# Patient Record
Sex: Female | Born: 1995 | Race: Black or African American | Hispanic: No | Smoking: Current every day smoker
Health system: Southern US, Community
[De-identification: ages and names within clinical notes are randomized; demographics above are authoritative.]

## PROBLEM LIST (undated history)

## (undated) DIAGNOSIS — L309 Dermatitis, unspecified: Secondary | ICD-10-CM

## (undated) HISTORY — DX: Dermatitis, unspecified: L30.9

## (undated) HISTORY — PX: NO PAST SURGERIES: SHX2092

---
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
## 2017-10-27 ENCOUNTER — Encounter: Payer: Self-pay | Admitting: *Deleted

## 2017-10-27 ENCOUNTER — Emergency Department: Payer: Self-pay

## 2017-10-27 ENCOUNTER — Other Ambulatory Visit: Payer: Self-pay

## 2017-10-27 ENCOUNTER — Emergency Department
Admission: EM | Admit: 2017-10-27 | Discharge: 2017-10-27 | Disposition: A | Discharge status: Home / Self Care | Payer: Self-pay | Attending: Emergency Medicine | Admitting: Emergency Medicine

## 2017-10-27 DIAGNOSIS — F17200 Nicotine dependence, unspecified, uncomplicated: Secondary | ICD-10-CM | POA: Insufficient documentation

## 2017-10-27 DIAGNOSIS — R0789 Other chest pain: Secondary | ICD-10-CM | POA: Insufficient documentation

## 2017-10-27 DIAGNOSIS — Z79899 Other long term (current) drug therapy: Secondary | ICD-10-CM | POA: Insufficient documentation

## 2017-10-27 LAB — CBC
HEMATOCRIT: 38.7 % (ref 35.0–47.0)
Hemoglobin: 12.7 g/dL (ref 12.0–16.0)
MCH: 29.5 pg (ref 26.0–34.0)
MCHC: 32.9 g/dL (ref 32.0–36.0)
MCV: 89.5 fL (ref 80.0–100.0)
PLATELETS: 384 10*3/uL (ref 150–440)
RBC: 4.32 MIL/uL (ref 3.80–5.20)
RDW: 14.5 % (ref 11.5–14.5)
WBC: 8.6 10*3/uL (ref 3.6–11.0)

## 2017-10-27 LAB — BASIC METABOLIC PANEL
Anion gap: 8 (ref 5–15)
BUN: 9 mg/dL (ref 6–20)
CO2: 26 mmol/L (ref 22–32)
CREATININE: 0.81 mg/dL (ref 0.44–1.00)
Calcium: 9.4 mg/dL (ref 8.9–10.3)
Chloride: 105 mmol/L (ref 101–111)
GFR calc Af Amer: >60 mL/min (ref >60)
GLUCOSE: 89 mg/dL (ref 65–99)
POTASSIUM: 3.9 mmol/L (ref 3.5–5.1)
Sodium: 139 mmol/L (ref 135–145)

## 2017-10-27 LAB — TROPONIN I: Troponin I: <0.03 ng/mL (ref <0.03)

## 2017-10-27 NOTE — ED Notes (Signed)
Pt reports that symptoms started when she was smoking a black and mild on Thursday.  Pt states that she has smoked these in the past and denies any changes to this.  Pt reports that every time she smokes black and milds she now has chest pain.  Pt advised by this RN to avoid smoking black and milds.

## 2017-10-27 NOTE — ED Notes (Signed)
Pt discharged to home.  Family member driving.  Discharge instructions reviewed.  Verbalized understanding.  No questions or concerns at this time.  Teach back verified.  Pt in NAD.  No items left in ED.   

## 2017-10-27 NOTE — ED Triage Notes (Signed)
Pt reports right sided chest pain for the past three days. No cough or congestions reported and no SOB. Pain is worse when straining but not when pressure is applied to chest. Pt denies heart hx. No trauma to chest reported.

## 2017-10-27 NOTE — ED Provider Notes (Signed)
[facility] Emergency Department Provider Note  Time seen: 3:59 PM  I have reviewed the triage vital signs and the nursing notes.   HISTORY  Chief Complaint Chest Pain    HPI KENYATTE GRUBER is a 22 y.o. female with no past medical history presents to the emergency department for 3 days of chest pain.  According to the patient 3 days ago she was smoking a black and mild cigar when she developed central chest discomfort.  States the chest pain will come and go intermittently, is brief when it occurs and will go away for minutes or longer at a time and then come back randomly.  Denies any known inciting event such as deep breathing eating.  States it is somewhat worse if she coughs or if she pushes on her chest.  No leg pain or swelling.  No pleuritic pain.  During my examination the patient appears well, calm, cooperative, no distress, heart rate of 70 bpm throughout the exam.   No past medical history on file.  There are no active problems to display for this patient.   No past surgical history on file.  Prior to Admission medications   Medication Sig Start Date End Date Taking? Authorizing Provider  naproxen (NAPROSYN) 500 MG tablet Take 1 tablet (500 mg total) by mouth 2 (two) times daily with a meal. 01/30/16   Cuthriell, Delorise Royals, PA-C    No Known Allergies  No family history on file.  Social History Social History   Tobacco Use  . Smoking status: Current Some Day Smoker  . Smokeless tobacco: Never Used  Substance Use Topics  . Alcohol use: No  . Drug use: No    Review of Systems Constitutional: Negative for fever, patient does state intermittent chills over the past few days Eyes: Negative for visual complaints ENT: Negative for recent illness/congestion Cardiovascular: Intermittent chest pain, mild to moderate, dull central chest pain that comes and goes. Respiratory: Negative for shortness of breath.   Gastrointestinal: Negative for  abdominal pain, vomiting and diarrhea. Genitourinary: Negative for urinary compaints Musculoskeletal: Negative for leg pain or swelling Skin: Negative for skin complaints  Neurological: Negative for headache All other ROS negative  ____________________________________________   PHYSICAL EXAM:  VITAL SIGNS: ED Triage Vitals  Enc Vitals Group     BP 10/27/17 1349 (!) 128/54     Pulse Rate 10/27/17 1349 99     Resp 10/27/17 1349 16     Temp 10/27/17 1349 98 F (36.7 C)     Temp Source 10/27/17 1349 Oral     SpO2 10/27/17 1349 100 %     Weight 10/27/17 1350 135 lb (61.2 kg)     Height 10/27/17 1350 5\' 7"  (1.702 m)     Head Circumference --      Peak Flow --      Pain Score 10/27/17 1350 6     Pain Loc --      Pain Edu? --      Excl. in GC? --    Constitutional: Alert and oriented. Well appearing and in no distress. Eyes: Normal exam ENT   Head: Normocephalic and atraumatic.   Mouth/Throat: Mucous membranes are moist. Cardiovascular: Normal rate, regular rhythm. No murmur Respiratory: Normal respiratory effort without tachypnea nor retractions. Breath sounds are clear.  Moderate central chest wall/sternal tenderness to palpation Gastrointestinal: Soft and nontender. No distention.  Musculoskeletal: Nontender with normal range of motion in all extremities. No lower extremity tenderness or  edema. Neurologic:  Normal speech and language. No gross focal neurologic deficits are appreciated. Skin:  Skin is warm, dry and intact.  Psychiatric: Mood and affect are normal.   ____________________________________________    EKG  EKG reviewed and interpreted by myself shows normal sinus rhythm 89 bpm with a narrow QRS, normal axis, normal intervals, no concerning ST changes.  Reassuring EKG.  ____________________________________________    RADIOLOGY  Chest x-ray clear  ____________________________________________   INITIAL IMPRESSION / ASSESSMENT AND PLAN / ED  COURSE  Pertinent labs & imaging results that were available during my care of the patient were reviewed by me and considered in my medical decision making (see chart for details).  Patient presents emergency department for intermittent chest discomfort over the past 3 days which started while smoking a black a mild cigar.  Differential would include muscular skeletal pain, ACS, pleurisy, less likely PE given now normal vitals with no pleuritic nature to her discomfort and a normal physical exam.  Patient's physical exam is extremely reassuring, moderate chest wall tenderness to palpation highly suspect musculoskeletal discomfort/inflammation.  Chest x-ray normal.  Labs are normal including negative troponin.  EKG is reassuring.  I discussed using ibuprofen 400 mg twice daily for the next 7 days as needed for discomfort and following up with her doctor.  Patient agreeable to this plan of care.   ____________________________________________   FINAL CLINICAL IMPRESSION(S) / ED DIAGNOSES  Chest wall pain    Minna AntisPaduchowski, Keli Buehner, MD 10/27/17 [id number]

## 2017-10-27 NOTE — Discharge Instructions (Addendum)
You have been seen in the emergency department today for chest pain. Your workup has shown normal results. As we discussed please follow-up with your primary care physician in the next 1-2 days for recheck. Return to the emergency department for any further chest pain, trouble breathing, or any other symptom personally concerning to yourself.  Please use over-the-counter ibuprofen 400 mg every 12 hours as needed for discomfort for the next 7 days.  Discontinue use after 7 days.

## 2018-11-13 ENCOUNTER — Emergency Department
Admission: EM | Admit: 2018-11-13 | Discharge: 2018-11-13 | Disposition: A | Discharge status: Home / Self Care | Payer: Self-pay | Attending: Emergency Medicine | Admitting: Emergency Medicine

## 2018-11-13 ENCOUNTER — Encounter: Payer: Self-pay | Admitting: Emergency Medicine

## 2018-11-13 ENCOUNTER — Other Ambulatory Visit: Payer: Self-pay

## 2018-11-13 DIAGNOSIS — Z79899 Other long term (current) drug therapy: Secondary | ICD-10-CM | POA: Insufficient documentation

## 2018-11-13 DIAGNOSIS — F17200 Nicotine dependence, unspecified, uncomplicated: Secondary | ICD-10-CM | POA: Insufficient documentation

## 2018-11-13 DIAGNOSIS — K115 Sialolithiasis: Secondary | ICD-10-CM

## 2018-11-13 HISTORY — DX: Sialolithiasis: K11.5

## 2018-11-13 MED ORDER — AMOXICILLIN 500 MG PO CAPS: 500.0000 mg | ORAL_CAPSULE | Freq: Three times a day (TID) | ORAL | 0 refills | Status: DC

## 2018-11-13 NOTE — ED Triage Notes (Signed)
Sore throat since yesterday.

## 2018-11-13 NOTE — ED Provider Notes (Signed)
[facility] Emergency Department Provider Note  ____________________________________________   First MD Initiated Contact with Patient 11/13/18 1428     (approximate)  I have reviewed the triage vital signs and the nursing notes.   HISTORY  Chief Complaint Sore Throat    HPI RENADA ROEPER is a 23 y.o. female presents emergency department complaining of sore throat since yesterday.  She states when she eats the side of her throat gets very large and then will get smaller after about an hour.  She denies any fever or chills.  She denies any chest pain or shortness of breath.    History reviewed. No pertinent past medical history.  There are no active problems to display for this patient.   History reviewed. No pertinent surgical history.  Prior to Admission medications   Medication Sig Start Date End Date Taking? Authorizing Provider  amoxicillin (AMOXIL) 500 MG capsule Take 1 capsule (500 mg total) by mouth 3 (three) times daily. 11/13/18   Braley Luckenbaugh, Roselyn Bering, PA-C  naproxen (NAPROSYN) 500 MG tablet Take 1 tablet (500 mg total) by mouth 2 (two) times daily with a meal. 01/30/16   Cuthriell, Delorise Royals, PA-C    Allergies Patient has no known allergies.  No family history on file.  Social History Social History   Tobacco Use  . Smoking status: Current Some Day Smoker  . Smokeless tobacco: Never Used  Substance Use Topics  . Alcohol use: No  . Drug use: No    Review of Systems  Constitutional: No fever/chills Eyes: No visual changes. ENT: Positive sore throat. Respiratory: Denies cough Genitourinary: Negative for dysuria. Musculoskeletal: Negative for back pain. Skin: Negative for rash.    ____________________________________________   PHYSICAL EXAM:  VITAL SIGNS: ED Triage Vitals  Enc Vitals Group     BP 11/13/18 1402 130/72     Pulse Rate 11/13/18 1402 (!) 126     Resp --      Temp 11/13/18 1402 98.4 F (36.9 C)     Temp  Source 11/13/18 1402 Oral     SpO2 11/13/18 1402 100 %     Weight 11/13/18 1358 132 lb (59.9 kg)     Height 11/13/18 1404 5\' 6"  (1.676 m)     Head Circumference --      Peak Flow --      Pain Score 11/13/18 1358 4     Pain Loc --      Pain Edu? --      Excl. in GC? --     Constitutional: Alert and oriented. Well appearing and in no acute distress. Eyes: Conjunctivae are normal.  Head: Atraumatic. Nose: No congestion/rhinnorhea. Mouth/Throat: Mucous membranes are moist.  Throat with cobblestoning posteriorly Neck:  supple no lymphadenopathy noted, some swelling noted at the salivary gland on the left side of the neck Cardiovascular: Normal rate, regular rhythm. Heart sounds are normal Respiratory: Normal respiratory effort.  No retractions, lungs c t a  GU: deferred Musculoskeletal: FROM all extremities, warm and well perfused Neurologic:  Normal speech and language.  Skin:  Skin is warm, dry and intact. No rash noted. Psychiatric: Mood and affect are normal. Speech and behavior are normal.  ____________________________________________   LABS (all labs ordered are listed, but only abnormal results are displayed)  Labs Reviewed - No data to display ____________________________________________   ____________________________________________  RADIOLOGY    ____________________________________________   PROCEDURES  Procedure(s) performed: No  Procedures    ____________________________________________   INITIAL  IMPRESSION / ASSESSMENT AND PLAN / ED COURSE  Pertinent labs & imaging results that were available during my care of the patient were reviewed by me and considered in my medical decision making (see chart for details).   Patient is 23 year old female presents emergency department complaining of a sore throat with swelling to the left side of the neck upon eating and drinking.  Physical exam shows the throat to be cobblestoned.  Left side of the neck is  minimally swollen up along the salivary gland.  Remainder the exam is unremarkable  Explained the findings to the patient.  Explained her this is more of a salivary gland infection/stone.  She is to drink through a straw consistently for the next few days, take the antibiotic as prescribed.  If she is not improving in 2 to 3 days she needs to follow-up with the ENT.  If she is worsening she should return emergency department.  She states she understands will comply.  She was discharged in stable condition.     As part of my medical decision making, I reviewed the following data within the electronic MEDICAL RECORD NUMBER Nursing notes reviewed and incorporated, Old chart reviewed, Notes from prior ED visits and [place] Controlled Substance Database  ____________________________________________   FINAL CLINICAL IMPRESSION(S) / ED DIAGNOSES  Final diagnoses:  Salivary stone      NEW MEDICATIONS STARTED DURING THIS VISIT:  New Prescriptions   AMOXICILLIN (AMOXIL) 500 MG CAPSULE    Take 1 capsule (500 mg total) by mouth 3 (three) times daily.     Note:  This document was prepared using Dragon voice recognition software and may include unintentional dictation errors.    Faythe GheeFisher, Jaken Fregia W, PA-C 11/13/18 1438    Emily FilbertWilliams, Jonathan E, MD 11/13/18 [phone]

## 2018-11-13 NOTE — Discharge Instructions (Addendum)
Follow-up with [facility] if not better in 2 to 3 days.  Drink through a straw for the next few days.  Take the medication as prescribed.  If you are worsening return emergency department.  If the area is not improving you should follow-up with the ENT doctors.

## 2019-12-13 ENCOUNTER — Other Ambulatory Visit: Payer: Self-pay

## 2019-12-13 ENCOUNTER — Emergency Department
Admission: EM | Admit: 2019-12-13 | Discharge: 2019-12-13 | Disposition: A | Discharge status: Home / Self Care | Payer: Self-pay | Attending: Emergency Medicine | Admitting: Emergency Medicine

## 2019-12-13 ENCOUNTER — Emergency Department: Payer: Self-pay

## 2019-12-13 ENCOUNTER — Encounter: Payer: Self-pay | Admitting: Emergency Medicine

## 2019-12-13 DIAGNOSIS — Y939 Activity, unspecified: Secondary | ICD-10-CM | POA: Insufficient documentation

## 2019-12-13 DIAGNOSIS — Y999 Unspecified external cause status: Secondary | ICD-10-CM | POA: Insufficient documentation

## 2019-12-13 DIAGNOSIS — Z87891 Personal history of nicotine dependence: Secondary | ICD-10-CM | POA: Insufficient documentation

## 2019-12-13 DIAGNOSIS — S300XXA Contusion of lower back and pelvis, initial encounter: Secondary | ICD-10-CM | POA: Insufficient documentation

## 2019-12-13 DIAGNOSIS — W228XXA Striking against or struck by other objects, initial encounter: Secondary | ICD-10-CM | POA: Insufficient documentation

## 2019-12-13 DIAGNOSIS — Z79899 Other long term (current) drug therapy: Secondary | ICD-10-CM | POA: Insufficient documentation

## 2019-12-13 DIAGNOSIS — Y929 Unspecified place or not applicable: Secondary | ICD-10-CM | POA: Insufficient documentation

## 2019-12-13 MED ORDER — LIDOCAINE 5 % EX PTCH
1.0000 | MEDICATED_PATCH | CUTANEOUS | Status: DC
  Administered 2019-12-13: 1 via TRANSDERMAL
  Filled 2019-12-13 (×2): qty 1

## 2019-12-13 MED ORDER — NAPROXEN 500 MG PO TABS: 500.0000 mg | ORAL_TABLET | Freq: Two times a day (BID) | ORAL | Status: DC

## 2019-12-13 MED ORDER — LIDOCAINE 5 % EX PTCH: 1.0000 | MEDICATED_PATCH | Freq: Two times a day (BID) | CUTANEOUS | 0 refills | Status: AC

## 2019-12-13 MED ORDER — TRAMADOL HCL 50 MG PO TABS
50.0000 mg | ORAL_TABLET | Freq: Once | ORAL | Status: AC
  Administered 2019-12-13: 18:00:00 50 mg via ORAL
  Filled 2019-12-13: qty 1

## 2019-12-13 MED ORDER — TRAMADOL HCL 50 MG PO TABS: 50.0000 mg | ORAL_TABLET | Freq: Four times a day (QID) | ORAL | 0 refills | Status: DC | PRN

## 2019-12-13 NOTE — Discharge Instructions (Addendum)
Follow discharge care instruction take medication as directed. °

## 2019-12-13 NOTE — ED Triage Notes (Signed)
Pt presents to ED via POV with c/o tailbone pain, pt states went to sit on couch and ended up sitting on a cup holder, pt states incident happened 3 days ago. Pt c/o increasing tailbone pain since then. Pt ambulatory without difficulty at this time. Pt states has been putting bruise cream on her tail bone without relief.

## 2019-12-13 NOTE — ED Notes (Signed)
See triage note. Pt ambulatory to room. Pot c/o 9/10 pain tailbone pain. Pt in NAD at this time.

## 2019-12-13 NOTE — ED Provider Notes (Signed)
[facility] Emergency Department Provider Note   ____________________________________________   First MD Initiated Contact with Patient 12/13/19 1543     (approximate)  I have reviewed the triage vital signs and the nursing notes.   HISTORY  Chief Complaint Tailbone Pain    HPI April Zuniga is a 24 y.o. female patient complain of coccyx pain secondary to contusion.  Patient states she jumped to sit on the sofa but hit the armrest instead of the cushion.  Incident occurred 3 days ago.  Patient the pain has increased and no relief with over-the-counter "bruise creams".  Patient states pain increased with trying to sit down so mostly lay on her stomach.  Patient denies bladder or bowel dysfunction.  Patient rates the pain as 8/10.  Patient described the pain as "achy".         History reviewed. No pertinent past medical history.  There are no problems to display for this patient.   History reviewed. No pertinent surgical history.  Prior to Admission medications   Medication Sig Start Date End Date Taking? Authorizing Provider  amoxicillin (AMOXIL) 500 MG capsule Take 1 capsule (500 mg total) by mouth 3 (three) times daily. 11/13/18   Fisher, Roselyn Bering, PA-C  lidocaine (LIDODERM) 5 % Place 1 patch onto the skin every 12 (twelve) hours. Remove & Discard patch within 12 hours or as directed by MD 12/13/19 12/12/20  Joni Reining, PA-C  naproxen (NAPROSYN) 500 MG tablet Take 1 tablet (500 mg total) by mouth 2 (two) times daily with a meal. 01/30/16   Cuthriell, Delorise Royals, PA-C  naproxen (NAPROSYN) 500 MG tablet Take 1 tablet (500 mg total) by mouth 2 (two) times daily with a meal. 12/13/19   Joni Reining, PA-C  traMADol (ULTRAM) 50 MG tablet Take 1 tablet (50 mg total) by mouth every 6 (six) hours as needed for moderate pain. 12/13/19   Joni Reining, PA-C    Allergies Patient has no known allergies.  No family history on file.  Social History Social  History   Tobacco Use  . Smoking status: Former Games developer  . Smokeless tobacco: Never Used  Substance Use Topics  . Alcohol use: No  . Drug use: No    Review of Systems Constitutional: No fever/chills Eyes: No visual changes. ENT: No sore throat. Cardiovascular: Denies chest pain. Respiratory: Denies shortness of breath. Gastrointestinal: No abdominal pain.  No nausea, no vomiting.  No diarrhea.  No constipation. Genitourinary: Negative for dysuria. Musculoskeletal: Coccyx pain. Skin: Negative for rash. Neurological: Negative for headaches, focal weakness or numbness.   ____________________________________________   PHYSICAL EXAM:  VITAL SIGNS: ED Triage Vitals  Enc Vitals Group     BP 12/13/19 1524 (!) 142/87     Pulse Rate 12/13/19 1524 (!) 108     Resp 12/13/19 1524 18     Temp 12/13/19 1524 98.5 F (36.9 C)     Temp src --      SpO2 12/13/19 1524 100 %     Weight 12/13/19 1526 135 lb (61.2 kg)     Height 12/13/19 1526 5\' 7"  (1.702 m)     Head Circumference --      Peak Flow --      Pain Score 12/13/19 1526 8     Pain Loc --      Pain Edu? --      Excl. in GC? --     Constitutional: Alert and oriented. Well appearing and  in no acute distress. Cardiovascular: Normal rate, regular rhythm. Grossly normal heart sounds.  Good peripheral circulation. Respiratory: Normal respiratory effort.  No retractions. Lungs CTAB. Genitourinary: Deferred Musculoskeletal: Chaperoned exam reveals moderate guarding palpation L4-S1.   Neurologic:  Normal speech and language. No gross focal neurologic deficits are appreciated. No gait instability. Skin:  Skin is warm, dry and intact. No rash noted.  No abrasion or ecchymosis.   Psychiatric: Mood and affect are normal. Speech and behavior are normal.  ____________________________________________   LABS (all labs ordered are listed, but only abnormal results are displayed)  Labs Reviewed  POC URINE PREG, ED    ____________________________________________  EKG   ____________________________________________  RADIOLOGY  ED MD interpretation:    Official radiology report(s): DG Sacrum/Coccyx  Result Date: 12/13/2019 CLINICAL DATA:  [place] onto cup older 3 days ago. Persistent tailbone pain. Initial encounter. EXAM: SACRUM AND COCCYX - 2+ VIEW COMPARISON:  None. FINDINGS: There is no evidence of fracture or other focal bone lesions. IMPRESSION: Negative. Electronically Signed   By: Marlaine Hind M.D.   On: 12/13/2019 17:05    ____________________________________________   PROCEDURES  Procedure(s) performed (including Critical Care):  Procedures   ____________________________________________   INITIAL IMPRESSION / ASSESSMENT AND PLAN / ED COURSE  As part of my medical decision making, I reviewed the following data within the [facility]      Patient presents with 3 days of coccyx pain secondary to contusion.  Discussed negative x-ray findings with patient.  Physical exam consistent with coccyx contusion.  Patient given discharge care instruction advised take medication as directed.  Patient advised follow-up PCP.    April Zuniga was evaluated in Emergency Department on 12/13/2019 for the symptoms described in the history of present illness. She was evaluated in the context of the global COVID-19 pandemic, which necessitated consideration that the patient might be at risk for infection with the SARS-CoV-2 virus that causes COVID-19. Institutional protocols and algorithms that pertain to the evaluation of patients at risk for COVID-19 are in a state of rapid change based on information released by regulatory bodies including the CDC and federal and state organizations. These policies and algorithms were followed during the patient's care in the ED.       ____________________________________________   FINAL CLINICAL IMPRESSION(S) / ED DIAGNOSES  Final diagnoses:   Contusion of coccyx, initial encounter     ED Discharge Orders         Ordered    traMADol (ULTRAM) 50 MG tablet  Every 6 hours PRN     12/13/19 1739    naproxen (NAPROSYN) 500 MG tablet  2 times daily with meals     12/13/19 1739    lidocaine (LIDODERM) 5 %  Every 12 hours     12/13/19 1739           Note:  This document was prepared using Dragon voice recognition software and may include unintentional dictation errors.    Sable Feil, PA-C 12/13/19 1741    Arta Silence, MD 12/13/19 2024

## 2019-12-13 NOTE — ED Notes (Signed)
Pt with c/o tenderness to tailbone. Pt has small, round, light-discoloration noted at coccyx. Skin is intact, no swelling, redness, or discharge noted.

## 2019-12-15 LAB — POCT PREGNANCY, URINE: Preg Test, Ur: NEGATIVE

## 2019-12-16 ENCOUNTER — Other Ambulatory Visit: Payer: Self-pay

## 2019-12-16 ENCOUNTER — Encounter: Payer: Self-pay | Admitting: Emergency Medicine

## 2019-12-16 ENCOUNTER — Emergency Department
Admission: EM | Admit: 2019-12-16 | Discharge: 2019-12-16 | Disposition: A | Discharge status: Home / Self Care | Payer: Self-pay | Attending: Student in an Organized Health Care Education/Training Program | Admitting: Student in an Organized Health Care Education/Training Program

## 2019-12-16 DIAGNOSIS — L0501 Pilonidal cyst with abscess: Secondary | ICD-10-CM | POA: Insufficient documentation

## 2019-12-16 DIAGNOSIS — Z79899 Other long term (current) drug therapy: Secondary | ICD-10-CM | POA: Insufficient documentation

## 2019-12-16 DIAGNOSIS — Z87891 Personal history of nicotine dependence: Secondary | ICD-10-CM | POA: Insufficient documentation

## 2019-12-16 HISTORY — PX: PILONIDAL CYST DRAINAGE: SHX743

## 2019-12-16 MED ORDER — OXYCODONE-ACETAMINOPHEN 5-325 MG PO TABS: 1.0000 | ORAL_TABLET | Freq: Four times a day (QID) | ORAL | 0 refills | Status: AC | PRN

## 2019-12-16 MED ORDER — LIDOCAINE-PRILOCAINE 2.5-2.5 % EX CREA
TOPICAL_CREAM | Freq: Once | CUTANEOUS | Status: AC
  Filled 2019-12-16: qty 5

## 2019-12-16 MED ORDER — SULFAMETHOXAZOLE-TRIMETHOPRIM 800-160 MG PO TABS: 1.0000 | ORAL_TABLET | Freq: Two times a day (BID) | ORAL | 0 refills | Status: DC

## 2019-12-16 MED ORDER — LIDOCAINE-EPINEPHRINE 2 %-1:100000 IJ SOLN
10.0000 mL | Freq: Once | INTRAMUSCULAR | Status: AC
  Administered 2019-12-16: 10 mL

## 2019-12-16 MED ORDER — OXYCODONE-ACETAMINOPHEN 5-325 MG PO TABS
1.0000 | ORAL_TABLET | ORAL | Status: DC | PRN
  Administered 2019-12-16: 1 via ORAL
  Filled 2019-12-16: qty 1

## 2019-12-16 NOTE — ED Notes (Signed)
See triage note  Presents with possible pilonidal abscess   States she was seen 3 days ago for pain is tailbone  States she sat down hard on something on the sofa   States the tailbone pain is better but now has possible abscess

## 2019-12-16 NOTE — ED Triage Notes (Signed)
Pt reports an abscess to her buttocks and needs it drained.

## 2019-12-16 NOTE — ED Provider Notes (Signed)
[facility] Emergency Department Provider Note  ____________________________________________  Time seen: Approximately 3:05 PM  I have reviewed the triage vital signs and the nursing notes.   HISTORY  Chief Complaint Abscess   HPI April Zuniga is a 24 y.o. female presenting to the emergency department for treatment and evaluation of tender area overlying her tailbone.  3 days ago she was here after she stepped down really hard on the console of her couch and sustained a bruised tailbone.  She was prescribed tramadol and Naprosyn which have helped but 2 days ago she noticed a very tender raised area on the right buttock/tailbone that has continued to increase in size and become more painful.  No previous skin infections or abscess.  No previous pilonidal cyst.   History reviewed. No pertinent past medical history.  There are no problems to display for this patient.   History reviewed. No pertinent surgical history.  Prior to Admission medications   Medication Sig Start Date End Date Taking? Authorizing Provider  lidocaine (LIDODERM) 5 % Place 1 patch onto the skin every 12 (twelve) hours. Remove & Discard patch within 12 hours or as directed by MD 12/13/19 12/12/20  Joni Reining, PA-C  naproxen (NAPROSYN) 500 MG tablet Take 1 tablet (500 mg total) by mouth 2 (two) times daily with a meal. 12/13/19   Joni Reining, PA-C  oxyCODONE-acetaminophen (PERCOCET) 5-325 MG tablet Take 1 tablet by mouth every 6 (six) hours as needed. 12/16/19 12/15/20  Olita Takeshita, Rulon Eisenmenger B, FNP  sulfamethoxazole-trimethoprim (BACTRIM DS) 800-160 MG tablet Take 1 tablet by mouth 2 (two) times daily. 12/16/19   Aeva Posey, Rulon Eisenmenger B, FNP  traMADol (ULTRAM) 50 MG tablet Take 1 tablet (50 mg total) by mouth every 6 (six) hours as needed for moderate pain. 12/13/19   Joni Reining, PA-C    Allergies Patient has no known allergies.  No family history on file.  Social History Social History   Tobacco  Use  . Smoking status: Former Games developer  . Smokeless tobacco: Never Used  Substance Use Topics  . Alcohol use: No  . Drug use: No    Review of Systems  Constitutional: Negative for fever. Respiratory: Negative for cough or shortness of breath.  Musculoskeletal: Negative for myalgias Skin: Positive for red, tender, raised area to the tailbone. Neurological: Negative for numbness or paresthesias. ____________________________________________   PHYSICAL EXAM:  VITAL SIGNS: ED Triage Vitals  Enc Vitals Group     BP 12/16/19 1415 125/64     Pulse Rate 12/16/19 1415 94     Resp 12/16/19 1415 20     Temp 12/16/19 1415 98.1 F (36.7 C)     Temp Source 12/16/19 1415 Oral     SpO2 12/16/19 1415 97 %     Weight 12/16/19 1413 135 lb (61.2 kg)     Height 12/16/19 1413 5\' 7"  (1.702 m)     Head Circumference --      Peak Flow --      Pain Score 12/16/19 1413 7     Pain Loc --      Pain Edu? --      Excl. in GC? --      Constitutional: Well appearing. Eyes: Conjunctivae are clear without discharge or drainage. Nose: No rhinorrhea noted. Mouth/Throat: Airway is patent.  Neck: No stridor. Unrestricted range of motion observed. Cardiovascular: Capillary refill is <3 seconds.  Respiratory: Respirations are even and unlabored.. Musculoskeletal: Unrestricted range of motion observed. Neurologic: Awake,  alert, and oriented x 4.  Skin: Pilonidal cyst overlying the natal cleft and toward the right buttock without drainage.  Surrounding erythema is present.  ____________________________________________   LABS (all labs ordered are listed, but only abnormal results are displayed)  Labs Reviewed - No data to display ____________________________________________  EKG  Not indicated. ____________________________________________  RADIOLOGY  Not indicated ____________________________________________   PROCEDURES  .Marland KitchenIncision and Drainage  Date/Time: 12/16/2019 4:24 PM Performed  by: Victorino Dike, FNP Authorized by: Victorino Dike, FNP   Consent:    Consent obtained:  Verbal   Consent given by:  Patient   Risks discussed:  Bleeding, incomplete drainage, pain and damage to other organs   Alternatives discussed:  No treatment Universal protocol:    Procedure explained and questions answered to patient or proxy's satisfaction: yes     Relevant documents present and verified: yes     Patient identity confirmed:  Verbally with patient Location:    Type:  Pilonidal cyst   Location:  Anogenital Pre-procedure details:    Skin preparation:  Betadine Anesthesia (see MAR for exact dosages):    Anesthesia method:  Local infiltration and topical application   Topical anesthetic:  LET   Local anesthetic:  Lidocaine 1% WITH epi Procedure type:    Complexity:  Complex Procedure details:    Incision types:  Single straight   Incision depth:  Subcutaneous   Scalpel blade:  11   Wound management:  Probed and deloculated, irrigated with saline and extensive cleaning   Drainage:  Purulent   Drainage amount:  Copious   Wound treatment:  Drain placed   Packing materials:  1/4 in iodoform gauze Post-procedure details:    Patient tolerance of procedure:  Tolerated well, no immediate complications   ____________________________________________   INITIAL IMPRESSION / ASSESSMENT AND PLAN / ED COURSE  April Zuniga is a 24 y.o. female presenting to the emergency department for treatment and evaluation of pilonidal cyst.  Plan will be to apply EMLA cream and then drain the cyst.  Plan and procedure discussed with the patient who verbalizes understanding and agreement.   Pilonidal cyst drained as above. Patient tolerated procedure well. She is to pull the packing out in 2 days. She is to follow up with primary care if symptoms not improving. She is to return to the ER for symptoms that change or worsen if unable to schedule an appointment.  Medications   oxyCODONE-acetaminophen (PERCOCET/ROXICET) 5-325 MG per tablet 1 tablet (1 tablet Oral Given 12/16/19 1530)  lidocaine-prilocaine (EMLA) cream ( Topical Given by Other 12/16/19 1639)  lidocaine-EPINEPHrine (XYLOCAINE W/EPI) 2 %-1:100000 (with pres) injection 10 mL (10 mLs Infiltration Given by Other 12/16/19 1639)     Pertinent labs & imaging results that were available during my care of the patient were reviewed by me and considered in my medical decision making (see chart for details).  ____________________________________________   FINAL CLINICAL IMPRESSION(S) / ED DIAGNOSES  Final diagnoses:  Pilonidal cyst with abscess    ED Discharge Orders         Ordered    sulfamethoxazole-trimethoprim (BACTRIM DS) 800-160 MG tablet  2 times daily     12/16/19 1622    oxyCODONE-acetaminophen (PERCOCET) 5-325 MG tablet  Every 6 hours PRN     12/16/19 1622           Note:  This document was prepared using Dragon voice recognition software and may include unintentional dictation errors.   Victorino Dike, FNP  12/16/19 1719    Willy Eddy, MD 12/16/19 1826

## 2019-12-16 NOTE — Discharge Instructions (Signed)
Pull the packing out in 2 days.  Take the antibiotics as prescribed and until finished.  Return to the ER for symptoms that change, worsen or for new concerns if unable to see primary care.

## 2020-03-26 ENCOUNTER — Encounter: Payer: Self-pay | Admitting: Emergency Medicine

## 2020-03-26 ENCOUNTER — Emergency Department
Admission: EM | Admit: 2020-03-26 | Discharge: 2020-03-26 | Disposition: A | Discharge status: Home / Self Care | Payer: Self-pay | Attending: Emergency Medicine | Admitting: Emergency Medicine

## 2020-03-26 ENCOUNTER — Other Ambulatory Visit: Payer: Self-pay

## 2020-03-26 DIAGNOSIS — H6091 Unspecified otitis externa, right ear: Secondary | ICD-10-CM

## 2020-03-26 DIAGNOSIS — H60311 Diffuse otitis externa, right ear: Secondary | ICD-10-CM

## 2020-03-26 DIAGNOSIS — Z87891 Personal history of nicotine dependence: Secondary | ICD-10-CM | POA: Insufficient documentation

## 2020-03-26 DIAGNOSIS — Z79899 Other long term (current) drug therapy: Secondary | ICD-10-CM | POA: Insufficient documentation

## 2020-03-26 HISTORY — DX: Unspecified otitis externa, right ear: H60.91

## 2020-03-26 MED ORDER — COLY-MYCIN S 3.3-3-10-0.5 MG/ML OT SUSP: 3.0000 [drp] | Freq: Four times a day (QID) | OTIC | 0 refills | Status: DC

## 2020-03-26 MED ORDER — PSEUDOEPHEDRINE HCL ER 120 MG PO TB12: 120.0000 mg | ORAL_TABLET | Freq: Two times a day (BID) | ORAL | 2 refills | Status: DC | PRN

## 2020-03-26 NOTE — ED Triage Notes (Signed)
Pt c/o R ear pain x 1 week. Pt states can't hear out of her R ear, pt states pain with chewing while eating. Pt A&O x4, a-febrile. NAD noted in triage.

## 2020-03-26 NOTE — Discharge Instructions (Addendum)
Follow discharge care instruction use eardrops as directed. 

## 2020-03-26 NOTE — ED Provider Notes (Signed)
[facility] Emergency Department Provider Note   ____________________________________________   First MD Initiated Contact with Patient 03/26/20 1614     (approximate)  I have reviewed the triage vital signs and the nursing notes.   HISTORY  Chief Complaint Otalgia    HPI DEVI April Zuniga is a 24 y.o. female patient complain approximate 2 weeks of bilateral ear pain right greater than left status post prolonged swimming trip. Patient states decreased hearing in the right ear. States pain increases with chewing. Denies vertigo. No palliative measure for complaint.         History reviewed. No pertinent past medical history.  There are no problems to display for this patient.   History reviewed. No pertinent surgical history.  Prior to Admission medications   Medication Sig Start Date End Date Taking? Authorizing Provider  lidocaine (LIDODERM) 5 % Place 1 patch onto the skin every 12 (twelve) hours. Remove & Discard patch within 12 hours or as directed by MD 12/13/19 12/12/20  Joni Reining, PA-C  naproxen (NAPROSYN) 500 MG tablet Take 1 tablet (500 mg total) by mouth 2 (two) times daily with a meal. 12/13/19   Joni Reining, PA-C  neomycin-colistin-hydrocortisone-thonzonium (COLY-MYCIN S) 3.10-07-08-0.5 MG/ML OTIC suspension Place 3 drops into both ears 4 (four) times daily. 03/26/20   Joni Reining, PA-C  oxyCODONE-acetaminophen (PERCOCET) 5-325 MG tablet Take 1 tablet by mouth every 6 (six) hours as needed. 12/16/19 12/15/20  Triplett, Kasandra Knudsen, FNP  pseudoephedrine (SUDAFED) 120 MG 12 hr tablet Take 1 tablet (120 mg total) by mouth 2 (two) times daily as needed for congestion. 03/26/20 03/26/21  Joni Reining, PA-C  sulfamethoxazole-trimethoprim (BACTRIM DS) 800-160 MG tablet Take 1 tablet by mouth 2 (two) times daily. 12/16/19   Triplett, Rulon Eisenmenger B, FNP  traMADol (ULTRAM) 50 MG tablet Take 1 tablet (50 mg total) by mouth every 6 (six) hours as needed for  moderate pain. 12/13/19   Joni Reining, PA-C    Allergies Patient has no known allergies.  No family history on file.  Social History Social History   Tobacco Use  . Smoking status: Former Games developer  . Smokeless tobacco: Never Used  Vaping Use  . Vaping Use: Every day  Substance Use Topics  . Alcohol use: No  . Drug use: No    Review of Systems  Constitutional: No fever/chills Eyes: No visual changes. ENT: No sore throat. Bilateral ear pain. Cardiovascular: Denies chest pain. Respiratory: Denies shortness of breath. Gastrointestinal: No abdominal pain.  No nausea, no vomiting.  No diarrhea.  No constipation. Genitourinary: Negative for dysuria. Musculoskeletal: Negative for back pain. Skin: Negative for rash. Neurological: Negative for headaches, focal weakness or numbness.   ____________________________________________   PHYSICAL EXAM:  VITAL SIGNS: ED Triage Vitals [03/26/20 1510]  Enc Vitals Group     BP 119/72     Pulse Rate 88     Resp 17     Temp 98.7 F (37.1 C)     Temp Source Oral     SpO2 99 %     Weight 145 lb (65.8 kg)     Height 5\' 7"  (1.702 m)     Head Circumference      Peak Flow      Pain Score 0     Pain Loc      Pain Edu?      Excl. in GC?    Constitutional: Alert and oriented. Well appearing and in no acute  distress. Eyes: Conjunctivae are normal. PERRL. EOMI. Head: Atraumatic. Nose: No congestion/rhinnorhea. Mouth/Throat: Mucous membranes are moist.  Oropharynx non-erythematous.  EARS: Bilateral edematous ear canal. Hematological/Lymphatic/Immunilogical: No cervical lymphadenopathy. Cardiovascular: Normal rate, regular rhythm. Grossly normal heart sounds.  Good peripheral circulation. Respiratory: Normal respiratory effort.  No retractions. Lungs CTAB. Neurologic:  Normal speech and language. No gross focal neurologic deficits are appreciated. No gait instability. Skin:  Skin is warm, dry and intact. No rash noted. Psychiatric:  Mood and affect are normal. Speech and behavior are normal.  ____________________________________________   LABS (all labs ordered are listed, but only abnormal results are displayed)  Labs Reviewed - No data to display ____________________________________________  EKG   ____________________________________________  RADIOLOGY  ED MD interpretation:    Official radiology report(s): No results found.  ____________________________________________   PROCEDURES  Procedure(s) performed (including Critical Care):  Procedures   ____________________________________________   INITIAL IMPRESSION / ASSESSMENT AND PLAN / ED COURSE  As part of my medical decision making, I reviewed the following data within the electronic MEDICAL RECORD NUMBER     Patient presents with bilateral ear pain status post from incident. Patient complaint physical exam is consistent with otitis externa. Patient given discharge care instruction advised use eardrops as directed. Patient advised establish care with [facility].    [name] was evaluated in Emergency Department on 03/26/2020 for the symptoms described in the history of present illness. She was evaluated in the context of the global COVID-19 pandemic, which necessitated consideration that the patient might be at risk for infection with the SARS-CoV-2 virus that causes COVID-19. Institutional protocols and algorithms that pertain to the evaluation of patients at risk for COVID-19 are in a state of rapid change based on information released by regulatory bodies including the CDC and federal and state organizations. These policies and algorithms were followed during the patient's care in the ED.     [name] was evaluated in Emergency Department on 03/26/2020 for the symptoms described in the history of present illness. She was evaluated in the context of the global COVID-19 pandemic, which necessitated consideration that the patient might be  at risk for infection with the SARS-CoV-2 virus that causes COVID-19. Institutional protocols and algorithms that pertain to the evaluation of patients at risk for COVID-19 are in a state of rapid change based on information released by regulatory bodies including the CDC and federal and state organizations. These policies and algorithms were followed during the patient's care in the ED.    ____________________________________________   FINAL CLINICAL IMPRESSION(S) / ED DIAGNOSES  Final diagnoses:  Acute diffuse otitis externa of right ear     ED Discharge Orders         Ordered    neomycin-colistin-hydrocortisone-thonzonium (COLY-MYCIN S) 3.10-07-08-0.5 MG/ML OTIC suspension  4 times daily        03/26/20 1637    pseudoephedrine (SUDAFED) 120 MG 12 hr tablet  2 times daily PRN        03/26/20 1637           Note:  This document was prepared using Dragon voice recognition software and may include unintentional dictation errors.    Joni Reining, PA-C 03/26/20 1642    Delton Pra[place], MD 03/26/20 2350

## 2020-08-07 NOTE — L&D Delivery Note (Signed)
Delivery Note  April Zuniga is a G1P0101 at [redacted]w[redacted]d with an LMP of 09/24/20, consistent with [place] at [redacted]w[redacted]d.   First Stage: Labor onset: 0625 Augmentation: oxytocin Analgesia /Anesthesia intrapartum: epidural SROM at 0625  Second Stage: Complete dilation at 1716 Onset of pushing at 1730 FHR second stage category 2, FHT's 118, early's intermittent variables  Delivery of a viable baby boy on 06/09/2021  at 1806 by CNM/ Dr Dalbert Garnet  present Delivery of fetal head in OA position with restitution to LOT no nuchal cord;  Anterior then posterior shoulders delivered easily with gentle downward traction. Baby placed on mom's chest, and attended to by baby RN Cord double clamped after cessation of pulsation, cut by FOB  Cord blood sample collection: Not Indicated B POS Performed at [facility], [address] [place], [place] [address]  Collection of cord blood donation N/A Arterial cord blood sample N/A  Third Stage: Oxytocin bolus started after delivery of infant for hemorrhage prophylaxis  Placenta delivered manually intact with 3 VC @ 1812 Placenta disposition: discarded Uterine tone firm / bleeding small  2nd degree vaginal, sulcus and bilateral periurethral laceration identified  Anesthesia for repair: epidural Repair 2-0, 3-0 Vicryl Est. Blood Loss (mL): 350  Complications: none  Mom to postpartum.  Baby to Couplet care / Skin to Skin.  Newborn: Information for the patient's newborn:  Abiageal, Blowe [[id number]]  Live born female  Birth Weight:   APGAR: 8, 9  Newborn Delivery   Birth date/time: 06/09/2021 18:06:00 Delivery type: Vaginal, Spontaneous        Feeding planned: Breast/bottle  ---------- Chari Manning, CNM Certified Nurse Midwife [facility]

## 2020-12-13 ENCOUNTER — Other Ambulatory Visit: Payer: Self-pay

## 2020-12-13 ENCOUNTER — Ambulatory Visit (LOCAL_COMMUNITY_HEALTH_CENTER): Payer: Self-pay

## 2020-12-13 VITALS — BP 124/66 | Resp 16 | Ht 67.0 in | Wt 143.5 lb

## 2020-12-13 DIAGNOSIS — Z3201 Encounter for pregnancy test, result positive: Secondary | ICD-10-CM

## 2020-12-13 LAB — PREGNANCY, URINE: Preg Test, Ur: POSITIVE — AB

## 2020-12-13 MED ORDER — PRENATAL 27-0.8 MG PO TABS: 1.0000 | ORAL_TABLET | Freq: Every day | ORAL | 0 refills | Status: AC

## 2020-12-13 NOTE — Progress Notes (Signed)
UPT positive. Plans prenatal care at [facility]. To clerk for preadmit. 

## 2021-01-13 ENCOUNTER — Ambulatory Visit: Payer: Medicaid Other | Admitting: Physician Assistant

## 2021-01-13 ENCOUNTER — Encounter: Payer: Self-pay | Admitting: Physician Assistant

## 2021-01-13 ENCOUNTER — Other Ambulatory Visit: Payer: Self-pay

## 2021-01-13 VITALS — BP 93/57 | HR 91 | Temp 98.5°F | Wt 149.4 lb

## 2021-01-13 DIAGNOSIS — O0930 Supervision of pregnancy with insufficient antenatal care, unspecified trimester: Secondary | ICD-10-CM

## 2021-01-13 DIAGNOSIS — Z3400 Encounter for supervision of normal first pregnancy, unspecified trimester: Secondary | ICD-10-CM

## 2021-01-13 DIAGNOSIS — Z720 Tobacco use: Secondary | ICD-10-CM

## 2021-01-13 DIAGNOSIS — O99012 Anemia complicating pregnancy, second trimester: Secondary | ICD-10-CM

## 2021-01-13 DIAGNOSIS — F1290 Cannabis use, unspecified, uncomplicated: Secondary | ICD-10-CM

## 2021-01-13 DIAGNOSIS — O99019 Anemia complicating pregnancy, unspecified trimester: Secondary | ICD-10-CM

## 2021-01-13 HISTORY — DX: Anemia complicating pregnancy, unspecified trimester: O99.019

## 2021-01-13 LAB — URINALYSIS
Bilirubin, UA: NEGATIVE
Glucose, UA: NEGATIVE
Ketones, UA: NEGATIVE
Leukocytes,UA: NEGATIVE
Nitrite, UA: NEGATIVE
Protein,UA: NEGATIVE
RBC, UA: NEGATIVE
Specific Gravity, UA: 1.02 (ref 1.005–1.030)
Urobilinogen, Ur: 0.2 mg/dL (ref 0.2–1.0)
pH, UA: 7.5 (ref 5.0–7.5)

## 2021-01-13 LAB — HEMOGLOBIN, FINGERSTICK: Hemoglobin: 9.9 g/dL — ABNORMAL LOW (ref 11.1–15.9)

## 2021-01-13 MED ORDER — FERROUS SULFATE 325 (65 FE) MG PO TABS: 325.0000 mg | ORAL_TABLET | Freq: Every day | ORAL | 3 refills | Status: DC

## 2021-01-13 NOTE — Progress Notes (Addendum)
Urine drip reviewed. No treatment indicated.   Hgb= 9.9. Reviewed with provider A. Robbie Lis, PA-C during clinic visit.   Per standing order: Dispensed ferrous sulfate 325mg  and given to patient. Instructed to take one tablet BID with juice that has Vit C such as orange, apple, or grape juice.  Anemia panel order added.  Anemia in pregnancy added to problem list.  Counsel patient on Fe-rich foods and Anemia in Pregnancy Pamphlet given.   Patient verbalized understanding. Questions answered.   Hgb to be re-drawn ~ 02/10/21  04/13/21, RN

## 2021-01-13 NOTE — Progress Notes (Signed)
[facility] HEALTH DEPT [facility] [address] [place] [address] [phone]  INITIAL PRENATAL VISIT NOTE  Subjective:  April Zuniga is a 25 y.o. G1P0000 at [redacted]w[redacted]d being seen today to start prenatal care at the [facility] Department.  She is currently monitored for the following issues for this low-risk pregnancy and has Supervision of normal first pregnancy, antepartum; Late prenatal care; Marijuana use; Nicotine vapor product user; and Anemia affecting pregnancy on their problem list.  Patient reports  mild breast tenderness .  Contractions: Not present. Vag. Bleeding: None.  Movement: Absent. Denies leaking of fluid.   Indications for ASA therapy (per uptodate) One of the following: Previous pregnancy with preeclampsia, especially early onset and with an adverse outcome No Multifetal gestation No Chronic hypertension No Type 1 or 2 diabetes mellitus No Chronic kidney disease No Autoimmune disease (antiphospholipid syndrome, systemic lupus erythematosus) No  Two or more of the following: Nulliparity Yes Obesity (body mass index >30 kg/m2) No Family history of preeclampsia in mother or sister No Age ?35 years No Sociodemographic characteristics (African American race, low socioeconomic level) Yes Personal risk factors (eg, previous pregnancy with low birth weight or small for gestational age infant, previous adverse pregnancy outcome [eg, stillbirth], interval >10 years between pregnancies) No   The following portions of the patient's history were reviewed and updated as appropriate: allergies, current medications, past family history, past medical history, past social history, past surgical history and problem list. Problem list updated.  Objective:   Vitals:   01/13/21 1328  BP: (!) 93/57  Pulse: 91  Temp: 98.5 F (36.9 C)  Weight: 149 lb 6.4 oz (67.8 kg)    Fetal Status: Fetal Heart Rate (bpm): 158  Fundal Height: 15 cm Movement: Absent  Presentation: Undeterminable   Physical Exam Vitals and nursing note reviewed.  Constitutional:      General: She is not in acute distress.    Appearance: Normal appearance. She is well-developed.  HENT:     Head: Normocephalic and atraumatic.     Right Ear: External ear normal.     Left Ear: External ear normal.     Nose: Nose normal. No congestion or rhinorrhea.     Mouth/Throat:     Lips: Pink.     Mouth: Mucous membranes are moist.     Dentition: Normal dentition. No dental caries.     Pharynx: Oropharynx is clear. Uvula midline.     Comments: Dentition: good Eyes:     General: No scleral icterus.    Conjunctiva/sclera: Conjunctivae normal.  Neck:     Thyroid: No thyroid mass or thyromegaly.  Cardiovascular:     Rate and Rhythm: Normal rate.     Pulses: Normal pulses.     Comments: Extremities are warm and well perfused Pulmonary:     Effort: Pulmonary effort is normal.     Breath sounds: Normal breath sounds.  Chest:     Chest wall: No mass.  Breasts:    Tanner Score is 5.     Breasts are symmetrical.     Right: Normal. No mass, nipple discharge, skin change or axillary adenopathy.     Left: Normal. No mass, nipple discharge, skin change or axillary adenopathy.  Abdominal:     General: Abdomen is flat.     Palpations: Abdomen is soft.     Tenderness: There is no abdominal tenderness.     Comments: Gravid   Genitourinary:  General: Normal vulva.     Exam position: Lithotomy position.     Pubic Area: No rash.      Labia:        Right: No rash.        Left: No rash.      Vagina: Normal. No vaginal discharge.     Cervix: No cervical motion tenderness or friability.     Uterus: Normal. Enlarged (Gravid 14-16 week size). Not tender.      Adnexa: Right adnexa normal and left adnexa normal.     Rectum: Normal. No external hemorrhoid.  Musculoskeletal:     Right lower leg: No edema.     Left lower leg: No edema.   Lymphadenopathy:     Cervical: No cervical adenopathy.     Upper Body:     Right upper body: No axillary adenopathy.     Left upper body: No axillary adenopathy.  Skin:    General: Skin is warm.     Capillary Refill: Capillary refill takes less than 2 seconds.     Comments: Tattoos noted  Neurological:     Mental Status: She is alert.    Assessment and Plan:  Pregnancy: G1P0000 at [redacted]w[redacted]d  1. Supervision of normal first pregnancy, antepartum Reviewed prenatal care plan. Schedule fetal anat [place]. Reports has had COVID vaccine series, but not sure of card location. First Pap today. - HIV Antibody (routine testing w rflx) - Prenatal profile without Varicella/Rubella ([id number]) - HCV Ab w Reflex to Quant PCR - Urine Culture - Chlamydia/GC NAA, Confirmation - Hemoglobin, fingerstick - Urinalysis - Pap IG (Image Guided) - [id number] 7+Oxycodone-Bund  2. Late prenatal care OBCM referral  3. Marijuana use Enc cessation. UDS today. "Plan of Safe Care" info given.  4. Nicotine vapor product user Enc cessation.  5. Anemia affecting pregnancy, antepartum Treat per S.O. - Fe+CBC/D/Plt+TIBC+Fer+Retic - ferrous sulfate 325 (65 FE) MG tablet; Take 1 tablet (325 mg total) by mouth daily with breakfast.  Dispense: 100 tablet; Refill: 3   Discussed overview of care and coordination with inpatient delivery practices including [facility], [facility], [facility] and [facility].    Preterm labor symptoms and general obstetric precautions including but not limited to vaginal bleeding, contractions, leaking of fluid and fetal movement were reviewed in detail with the patient.  Please refer to After Visit Summary for other counseling recommendations.   Return in about 4 weeks (around 02/10/2021) for Routine prenatal care.  Future Appointments  Date Time Provider Department Center  02/10/2021  3:00 PM [facility] PROVIDER [facility] None    Landry Dyke, PA-C

## 2021-01-14 ENCOUNTER — Telehealth: Payer: Self-pay

## 2021-01-14 NOTE — Telephone Encounter (Signed)
Anatomy US ordered yesterday and as not yet scheduled, call made to scheduler. Appt scheduled for 02/22/2021 at 1 pm and to arrive at12:45 with full bladder. (This is first available [place] appt at any [facility] Korea faciltiy).  Call to client and left message to call clinic for [place] appt on Monday am 01/17/2021. Number to call provided.Jossie Ng, RN

## 2021-01-16 LAB — CHLAMYDIA/GC NAA, CONFIRMATION
Chlamydia trachomatis, NAA: NEGATIVE
Neisseria gonorrhoeae, NAA: NEGATIVE

## 2021-01-16 LAB — URINE CULTURE: Organism ID, Bacteria: NO GROWTH

## 2021-01-17 LAB — PAP IG (IMAGE GUIDED): PAP Smear Comment: 0

## 2021-01-17 NOTE — Telephone Encounter (Signed)
Location is at [facility] and is the [facility] U/S dept (NOT [facility]).  Phone call to pt. Left message on voicemail about U/S appt details.

## 2021-01-17 NOTE — Telephone Encounter (Signed)
When [facility] reaches pt, please discuss with pt:  #1 U/S appt details (see below).  #2 See RPR results printed off [facility] Stasia Cavalier provided to [facility]). RPR results indicate reactive (1:8). Printout in Nurse to Do box; not showing in Epic at this time.

## 2021-01-18 ENCOUNTER — Encounter: Payer: Self-pay | Admitting: Family Medicine

## 2021-01-18 DIAGNOSIS — O98119 Syphilis complicating pregnancy, unspecified trimester: Secondary | ICD-10-CM | POA: Insufficient documentation

## 2021-01-18 DIAGNOSIS — Z8619 Personal history of other infectious and parasitic diseases: Secondary | ICD-10-CM | POA: Insufficient documentation

## 2021-01-18 NOTE — Addendum Note (Signed)
Addended by: Jossie Ng on: 01/18/2021 12:46 PM   Modules accepted: Orders

## 2021-01-18 NOTE — Telephone Encounter (Signed)
Earlier this am requested hard copy of Prenatal Profile from [employer] [facility] as not yet available in [employer]. (On 01/17/2021, [facility] received report of positive syphilis result.) Per client, someone from [facility] called  her this am with syphilis result and told her and partner to schedule health department appt for treatment. Client counseled regarding need for Bicillin and need for partner to schedule STD appt ASAP for testing and treatment. Appf for client scheduled for 01/19/2021 in [facility] to talk with provider and initiate treatment.  Client also provided [place] appt date / time. Jossie Ng, RN

## 2021-01-18 NOTE — Progress Notes (Unsigned)
Some of client labs from 01/13/2021 [facility] IP appt are not releasing for viewing and work order has been placed per C. Gerilyn Pilgrim RN, Armed forces technical officer. Due to above, Ms. Gerilyn Pilgrim printed available labs and brought to clinic for review. RN reviewed / signed no growth urine culture and negative gonorrhea / chlamydia culture reports. Remainder of labs given to E. Sciora CNM for review with above explanation. Ms. Sherren Kerns aware client has appt for syphilis tomorrow in [facility]. Jossie Ng, RN

## 2021-01-18 NOTE — Progress Notes (Signed)
Hgb Fractionation (sickle cell) added to labs drawn at [facility] IP appt 01/13/21. Per [facility], they will notify us if unable to perform test. Jossie Ng, RN

## 2021-01-19 ENCOUNTER — Ambulatory Visit: Payer: Medicaid Other | Admitting: Physician Assistant

## 2021-01-19 ENCOUNTER — Other Ambulatory Visit: Payer: Self-pay

## 2021-01-19 VITALS — BP 98/60 | HR 89 | Temp 97.2°F | Wt 146.8 lb

## 2021-01-19 DIAGNOSIS — Z113 Encounter for screening for infections with a predominantly sexual mode of transmission: Secondary | ICD-10-CM

## 2021-01-19 DIAGNOSIS — O98119 Syphilis complicating pregnancy, unspecified trimester: Secondary | ICD-10-CM

## 2021-01-19 DIAGNOSIS — Z3400 Encounter for supervision of normal first pregnancy, unspecified trimester: Secondary | ICD-10-CM

## 2021-01-19 MED ORDER — PENICILLIN G BENZATHINE 1200000 UNIT/2ML IM SUSY
1.2000 10*6.[IU] | PREFILLED_SYRINGE | INTRAMUSCULAR | Status: AC
  Administered 2021-01-19 – 2021-02-03 (×3): 1.2 10*6.[IU] via INTRAMUSCULAR

## 2021-01-19 NOTE — Progress Notes (Signed)
Pt denies visits to ER since last appt at [facility]. Taking PNV and iron twice a day with orange/apple juice. [facility] contact card provided. Dicussed U/S appt on 02/22/21, MHC revisit with provider on 02/10/21, and discussed next two syphilis tx appts 01/26/21 and 02/02/21. Pt unsure about pediatrician, has resource list already.  Pt to clinic for tx of syphilis. See Dr. Cala Bradford Newton's note on problem list stating pt needed bicillin tx x3. Pt states she does not have any questions about syphilis or syphilis treatment. Bicillin LA 2.4 MU given per Sadie Haber, PA verbal order and bicillin medication order.

## 2021-01-20 ENCOUNTER — Telehealth: Payer: Self-pay

## 2021-01-20 LAB — FE+CBC/D/PLT+TIBC+FER+RETIC
Basophils Absolute: 0.1 10*3/uL (ref 0.0–0.2)
Basos: 1 %
EOS (ABSOLUTE): 0.3 10*3/uL (ref 0.0–0.4)
Eos: 3 %
Ferritin: 40 ng/mL (ref 15–150)
Hematocrit: 30.2 % — ABNORMAL LOW (ref 34.0–46.6)
Hemoglobin: 10.1 g/dL — ABNORMAL LOW (ref 11.1–15.9)
Immature Grans (Abs): 0.1 10*3/uL (ref 0.0–0.1)
Immature Granulocytes: 1 %
Iron Saturation: 16 % (ref 15–55)
Iron: 58 ug/dL (ref 27–159)
Lymphocytes Absolute: 1.5 10*3/uL (ref 0.7–3.1)
Lymphs: 16 %
MCH: 29.5 pg (ref 26.6–33.0)
MCHC: 33.4 g/dL (ref 31.5–35.7)
MCV: 88 fL (ref 79–97)
Monocytes Absolute: 0.7 10*3/uL (ref 0.1–0.9)
Monocytes: 8 %
Neutrophils Absolute: 6.7 10*3/uL (ref 1.4–7.0)
Neutrophils: 71 %
Platelets: 415 10*3/uL (ref 150–450)
RBC: 3.42 x10E6/uL — ABNORMAL LOW (ref 3.77–5.28)
RDW: 13.3 % (ref 11.7–15.4)
Retic Ct Pct: 1.9 % (ref 0.6–2.6)
Total Iron Binding Capacity: 357 ug/dL (ref 250–450)
UIBC: 299 ug/dL (ref 131–425)
WBC: 9.4 10*3/uL (ref 3.4–10.8)

## 2021-01-20 LAB — AFP TETRA
DIA Mom Value: 2.48
DIA Value (EIA): 390.66 pg/mL
DSR (By Age)    1 IN: 1100
DSR (Second Trimester) 1 IN: 341
Gestational Age: 15.9 WEEKS
MSAFP Mom: 1.06
MSAFP: 36.7 ng/mL
MSHCG Mom: 1.99
MSHCG: 86077 m[IU]/mL
Maternal Age At EDD: 23.1 yr
Osb Risk: 10000
T18 (By Age): 1:4285 {titer}
Test Results:: NEGATIVE
Weight: 149 [lb_av]
uE3 Mom: 0.82
uE3 Value: 0.74 ng/mL

## 2021-01-20 LAB — CBC/D/PLT+RPR+RH+ABO+AB SCR
Antibody Screen: NEGATIVE
Hepatitis B Surface Ag: NEGATIVE
RPR Ser Ql: REACTIVE — AB
Rh Factor: POSITIVE

## 2021-01-20 LAB — HIV-1/HIV-2 QUALITATIVE RNA
HIV-1 RNA, Qualitative: NONREACTIVE
HIV-2 RNA, Qualitative: NONREACTIVE

## 2021-01-20 LAB — HCV INTERPRETATION

## 2021-01-20 LAB — HCV AB W REFLEX TO QUANT PCR: HCV Ab: 0.2 s/co ratio (ref 0.0–0.9)

## 2021-01-20 LAB — RPR, QUANT+TP ABS (REFLEX)
Rapid Plasma Reagin, Quant: 1:8 {titer} — ABNORMAL HIGH
T Pallidum Abs: REACTIVE — AB

## 2021-01-20 NOTE — Telephone Encounter (Signed)
Hazle Coca CNM written order (result note attached to anemia profile, add B12 and folate to current specimen at [facility]). Call to [facility] and tests added. Per representative, agency will be notified if no available remaining specimen for testing. Jossie Ng, RN

## 2021-01-20 NOTE — Progress Notes (Signed)
Reviewed patient's labs and also contacted [facility] to check and see if patient had previous history of Syphilis. Per [facility], they have no previous history of patient testing positive or being treated for Syphilis.  Order put in to treat patient for Syphilis of unknown duration for Bicillin 2.4 mu IM per week for 3 weeks.

## 2021-01-21 LAB — B12 AND FOLATE PANEL
Folate: >20 ng/mL (ref >3.0)
Vitamin B-12: 550 pg/mL (ref 232–1245)

## 2021-01-21 LAB — HGB FRACTIONATION CASCADE
Hgb A2: 2.3 % (ref 1.8–3.2)
Hgb A: 97.7 % (ref 96.4–98.8)
Hgb F: 0 % (ref 0.0–2.0)
Hgb S: 0 %

## 2021-01-21 LAB — SPECIMEN STATUS REPORT

## 2021-01-23 LAB — 789231 7+OXYCODONE-BUND
Amphetamines, Urine: NEGATIVE ng/mL
BENZODIAZ UR QL: NEGATIVE ng/mL
Barbiturate screen, urine: NEGATIVE ng/mL
Cocaine (Metab.): NEGATIVE ng/mL
OPIATE SCREEN URINE: NEGATIVE ng/mL
Oxycodone/Oxymorphone, Urine: NEGATIVE ng/mL
PCP Quant, Ur: NEGATIVE ng/mL

## 2021-01-23 LAB — CANNABINOID CONFIRMATION, UR
CANNABINOIDS: POSITIVE — AB
Carboxy THC GC/MS Conf: 274 ng/mL

## 2021-01-24 NOTE — Addendum Note (Signed)
Addended by: Heywood Bene on: 01/24/2021 11:12 AM   Modules accepted: Orders

## 2021-01-25 ENCOUNTER — Other Ambulatory Visit: Payer: Self-pay | Admitting: Advanced Practice Midwife

## 2021-01-25 DIAGNOSIS — O99012 Anemia complicating pregnancy, second trimester: Secondary | ICD-10-CM

## 2021-01-25 NOTE — Progress Notes (Signed)
Hematology referral noted on tracking clipboard in [facility] nurse workroom (nurse to do cart). Jossie Ng, RN

## 2021-01-26 ENCOUNTER — Ambulatory Visit: Payer: Self-pay

## 2021-01-27 ENCOUNTER — Ambulatory Visit: Payer: Medicaid Other

## 2021-01-27 ENCOUNTER — Other Ambulatory Visit: Payer: Self-pay

## 2021-01-27 DIAGNOSIS — Z113 Encounter for screening for infections with a predominantly sexual mode of transmission: Secondary | ICD-10-CM

## 2021-01-27 DIAGNOSIS — O98119 Syphilis complicating pregnancy, unspecified trimester: Secondary | ICD-10-CM

## 2021-01-27 IMAGING — CR DG SACRUM/COCCYX 2+V
3 series · 3 of 3 positions shown · non-contrast
Comparison: None.

CLINICAL DATA: Fell onto cup older 3 days ago. Persistent tailbone
pain. Initial encounter.

EXAM:
SACRUM AND COCCYX - 2+ VIEW

[coccyx ap]
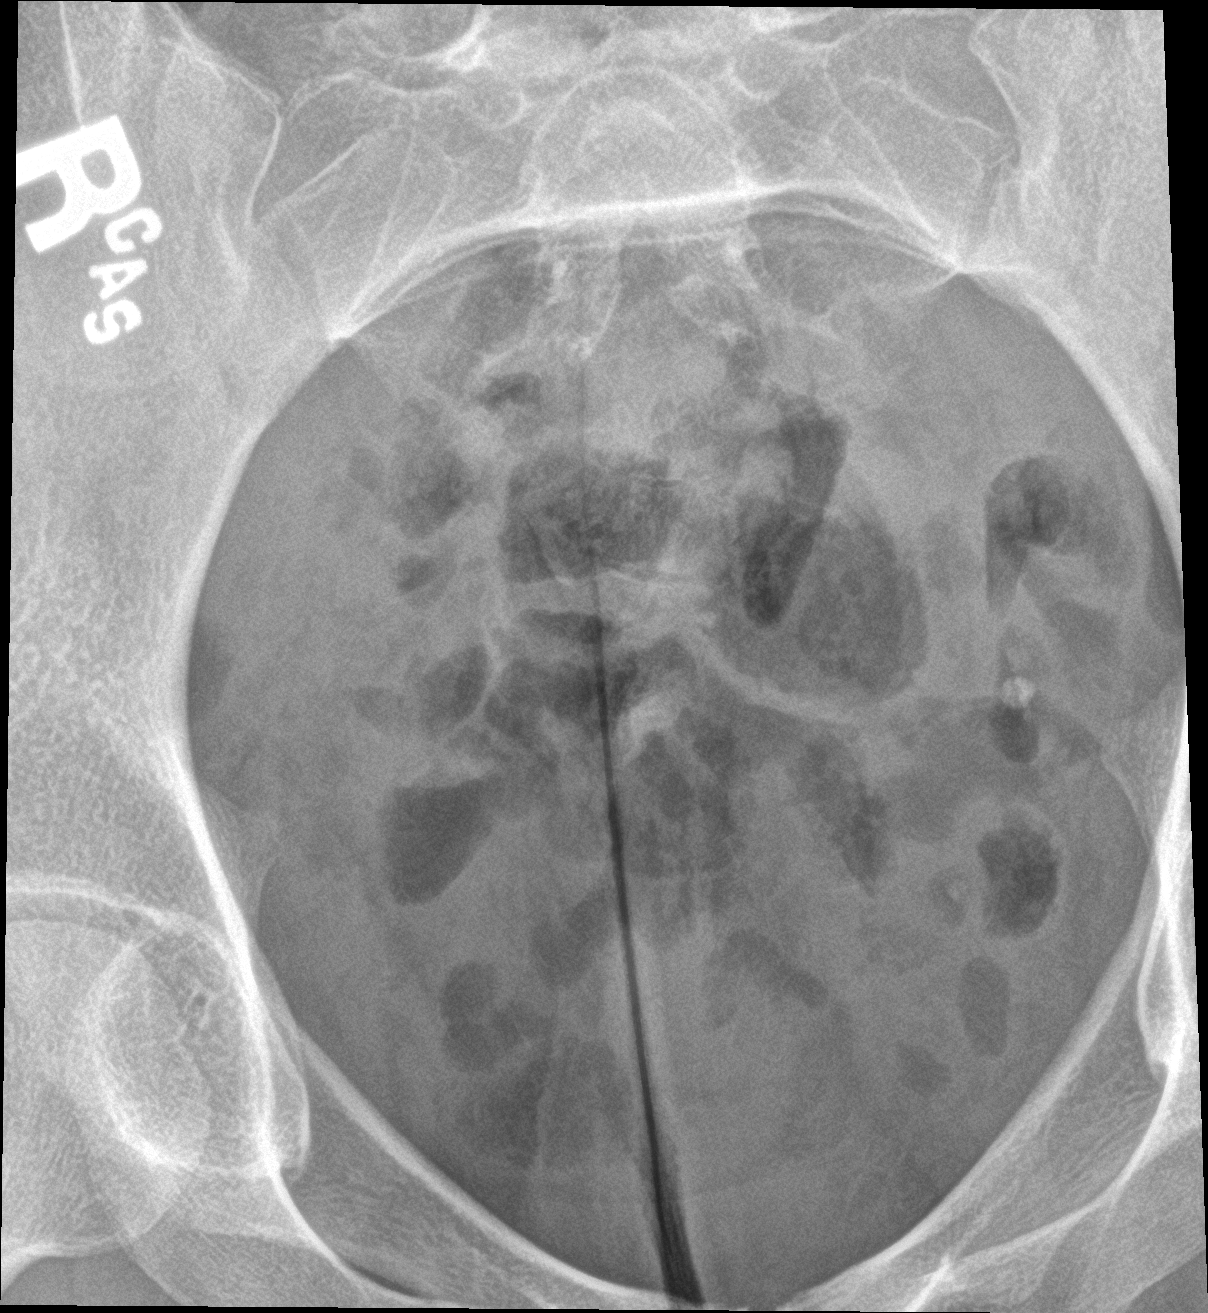

[sacrum ap]
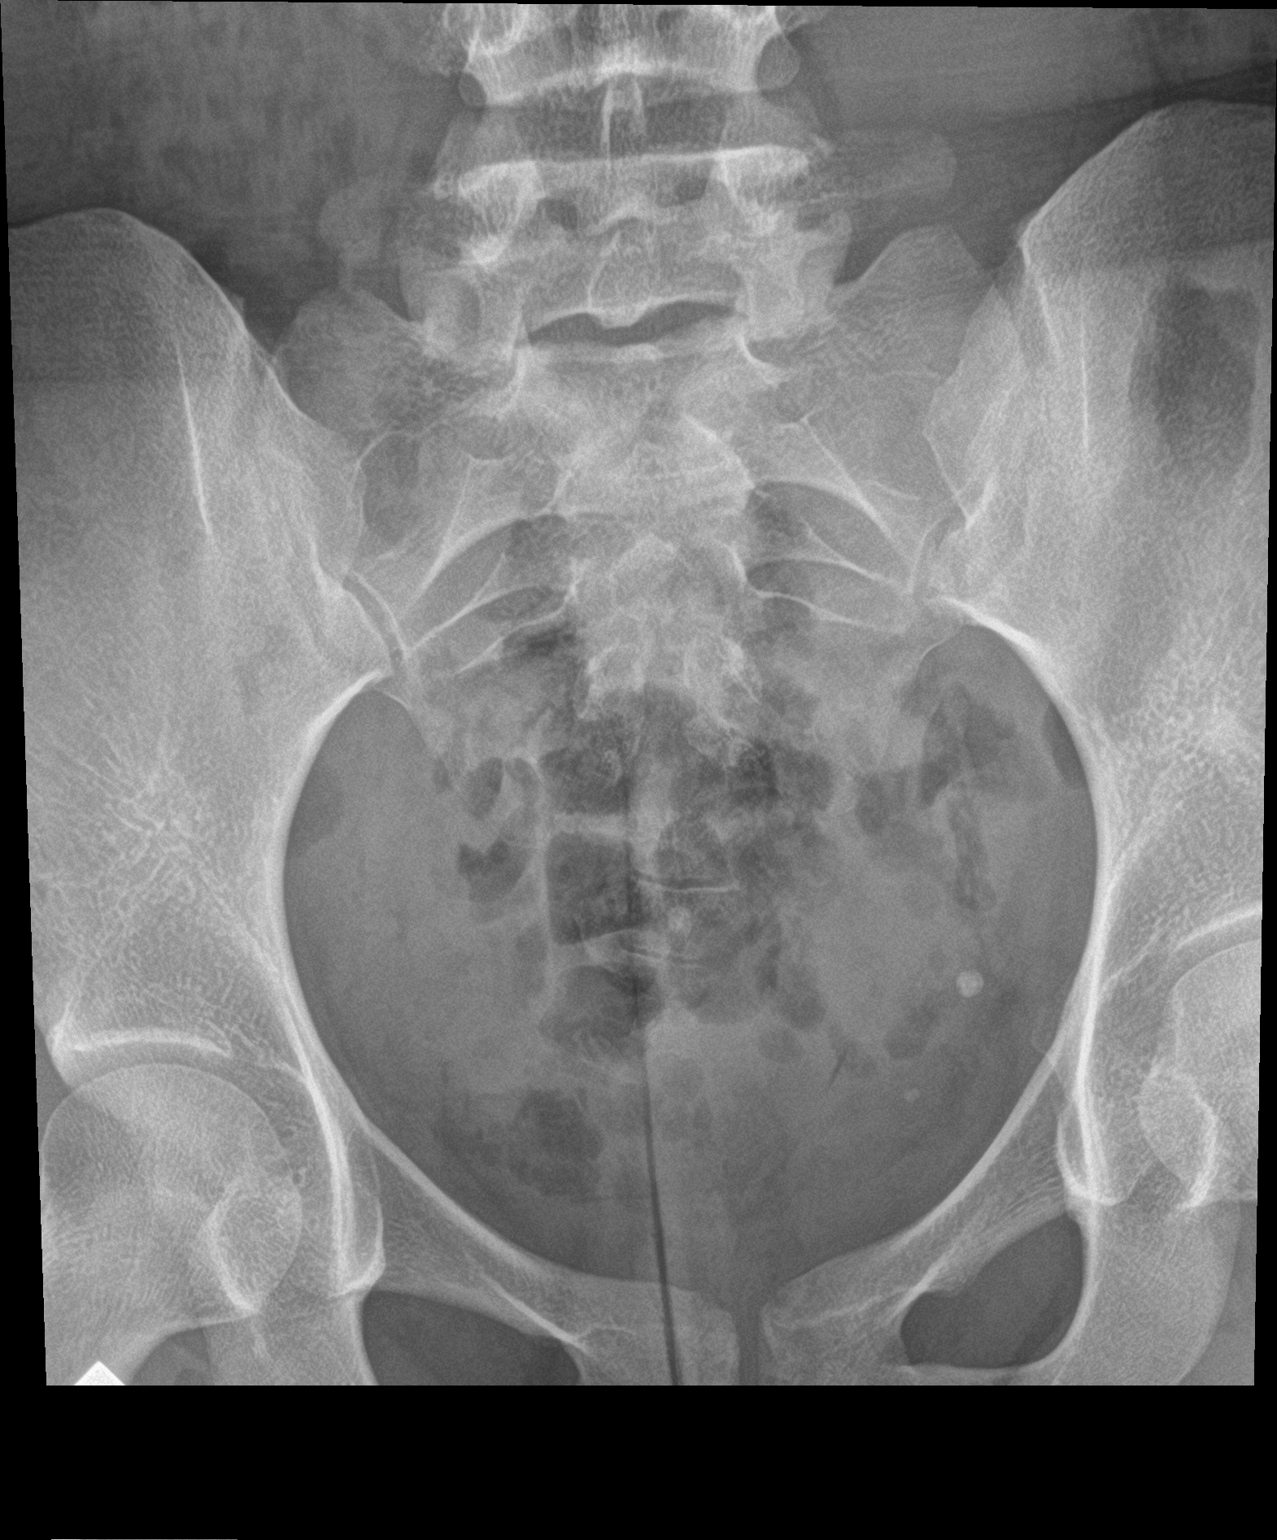

[sacrum lat]
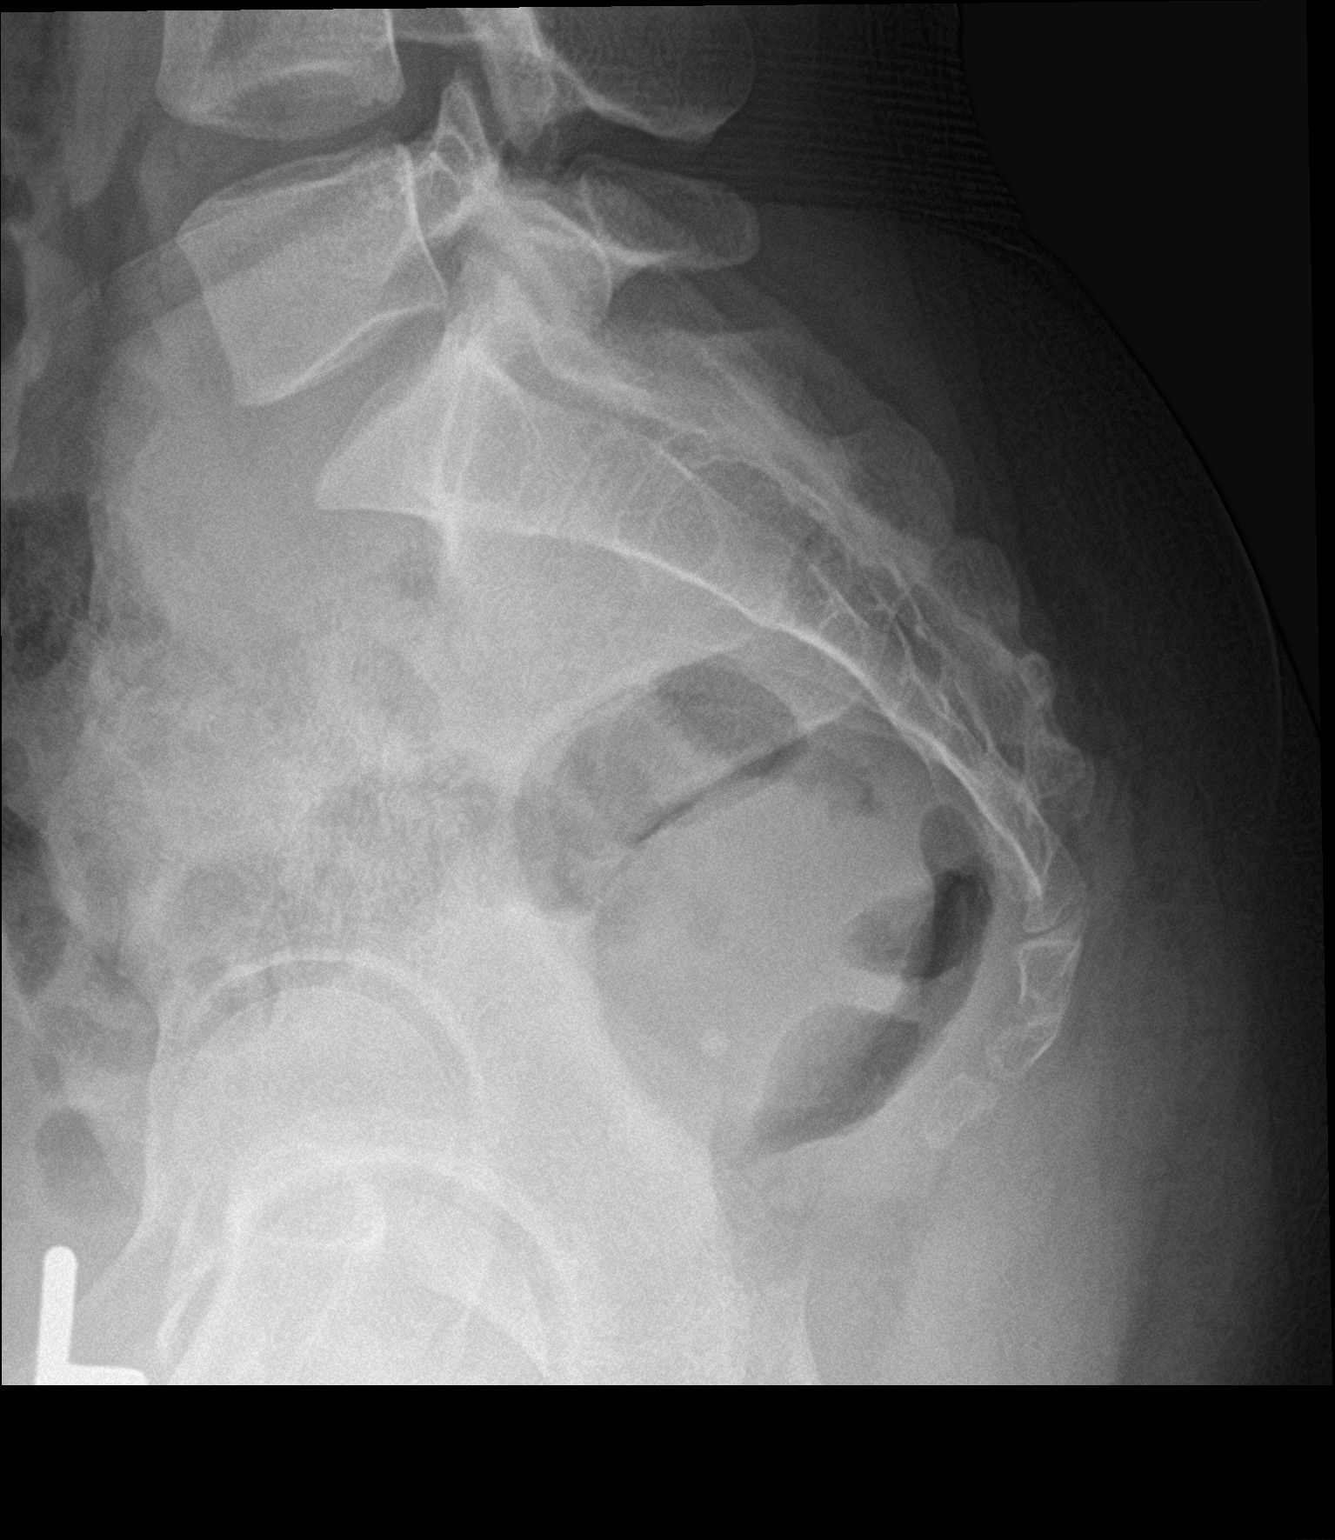

[3 of 3 positions shown; findings below may reference images not displayed]

FINDINGS: There is no evidence of fracture or other focal bone lesions.
IMPRESSION: Negative.

## 2021-01-27 NOTE — Progress Notes (Signed)
Client tolerated Bicillin injections today with minimal complaint. She remained in agency x15 minutes following injections and no problems experienced. Jossie Ng, RN

## 2021-01-28 ENCOUNTER — Telehealth: Payer: Self-pay

## 2021-01-28 NOTE — Telephone Encounter (Signed)
Client called [facility] and has appt with Dr. Orlie Dakin on 02/03/2021 at 1115. Jossie Ng, RN

## 2021-01-28 NOTE — Telephone Encounter (Signed)
Call to [facility] to ascertain status of hematology consult appt as e-referral placed 01/25/2021. Per Melissa in scheduling, she has left multiple messages on client's voicemail without a response. Call to client with above information and requested she call for appt now. Number to call provided. Jossie Ng, RN

## 2021-01-29 NOTE — Progress Notes (Signed)
[facility]  [phone] [phone]  ID: April Zuniga OB: [birth year]  MR#: [id number]  CSN#:[id number]  Patient Care Team: Patient, No Pcp Per (Inactive) as PCP - General (General Practice)  CHIEF COMPLAINT: Anemia affecting pregnancy.  INTERVAL HISTORY: Patient is a 25 year old female in the second trimester of her first pregnancy who was noted to have a mildly decreased hemoglobin on routine blood work.  She has intermittent weakness and fatigue and occasional nausea, but otherwise feels well.  She has no neurologic complaints.  She is gaining weight appropriately.  She denies any recent fevers or illnesses.  She has no chest pain, shortness of breath, cough, or hemoptysis.  She denies any nausea, vomiting, constipation, or diarrhea.  She has no urinary complaints.  Patient otherwise feels well and offers no further specific complaints today.  REVIEW OF SYSTEMS:   Review of Systems  Constitutional: Negative.  Negative for fever, malaise/fatigue and weight loss.  Respiratory: Negative.  Negative for cough, hemoptysis and shortness of breath.   Cardiovascular: Negative.  Negative for chest pain and leg swelling.  Gastrointestinal: Negative.  Negative for abdominal pain.  Genitourinary: Negative.  Negative for dysuria and hematuria.  Musculoskeletal: Negative.  Negative for back pain.  Skin: Negative.  Negative for rash.  Neurological: Negative.  Negative for dizziness, focal weakness, weakness and headaches.  Psychiatric/Behavioral: Negative.  The patient is not nervous/anxious.    As per HPI. Otherwise, a complete review of systems is negative.  PAST MEDICAL HISTORY: Past Medical History:  Diagnosis Date   Anemia affecting pregnancy 01/13/2021   Eczema    Otitis externa of right ear 03/26/2020   Salivary stone 11/13/2018    PAST SURGICAL HISTORY: Past Surgical History:  Procedure Laterality Date   NO PAST SURGERIES     PILONIDAL CYST  DRAINAGE  12/16/2019    FAMILY HISTORY: Family History  Problem Relation Age of Onset   Healthy Mother    Asthma Father    Bipolar disorder Father    Healthy Sister    Healthy Sister    Healthy Sister    Asthma Brother    Healthy Brother    Healthy Brother    Bipolar disorder Paternal Aunt    Bipolar disorder Paternal Uncle    Diabetes Maternal Grandmother    Healthy Maternal Grandfather    Heart attack Paternal Grandmother     ADVANCED DIRECTIVES (Y/N):  N  HEALTH MAINTENANCE: Social History   Tobacco Use   Smoking status: Former    Pack years: 0.00    Types: Cigarettes, E-cigarettes    Quit date: 12/14/2018    Years since quitting: 2.1    Passive exposure: Current   Smokeless tobacco: Never  Vaping Use   Vaping Use: Every day  Substance Use Topics   Alcohol use: Not Currently   Drug use: Not Currently    Frequency: 4.0 times per week    Types: Marijuana    Comment: Decrease use since pregnancy "very minial every other day a few "puffs"     Colonoscopy:  PAP:  Bone density:  Lipid panel:  Allergies  Allergen Reactions   Fish Allergy Hives    Current Outpatient Medications  Medication Sig Dispense Refill   ferrous sulfate 325 (65 FE) MG tablet Take 1 tablet (325 mg total) by mouth daily with breakfast. (Patient taking differently: Take 325 mg by mouth 2 (two) times daily with a meal.) 100 tablet 3   Prenatal Vit-Fe Fumarate-FA (MULTIVITAMIN-PRENATAL) 27-0.8 MG  TABS tablet Take 1 tablet by mouth daily at 12 noon. 100 tablet 0   No current facility-administered medications for this visit.    OBJECTIVE: Vitals:   02/03/21 1127  BP: 97/64  Pulse: 93  Resp: 18  Temp: 99.1 F (37.3 C)     Body mass index is 20.77 kg/m.    ECOG FS:0 - Asymptomatic  General: Well-developed, well-nourished, no acute distress. Eyes: Pink conjunctiva, anicteric sclera. HEENT: Normocephalic, moist mucous membranes. Lungs: No audible wheezing or coughing. Heart: Regular  rate and rhythm. Abdomen: Appears appropriate for gestational age. Musculoskeletal: No edema, cyanosis, or clubbing. Neuro: Alert, answering all questions appropriately. Cranial nerves grossly intact. Skin: No rashes or petechiae noted. Psych: Normal affect. Lymphatics: No cervical, calvicular, axillary or inguinal LAD.   LAB RESULTS:  Lab Results  Component Value Date   NA 139 10/27/2017   K 3.9 10/27/2017   CL 105 10/27/2017   CO2 26 10/27/2017   GLUCOSE 89 10/27/2017   BUN 9 10/27/2017   CREATININE 0.81 10/27/2017   CALCIUM 9.4 10/27/2017   GFRNONAA >60 10/27/2017   GFRAA >60 10/27/2017    Lab Results  Component Value Date   WBC 9.4 01/13/2021   NEUTROABS 6.7 01/13/2021   HGB 10.1 (L) 01/13/2021   HCT 30.2 (L) 01/13/2021   MCV 88 01/13/2021   PLT 415 01/13/2021   Lab Results  Component Value Date   IRON 58 01/13/2021   TIBC 357 01/13/2021   IRONPCTSAT 16 01/13/2021   Lab Results  Component Value Date   FERRITIN 40 01/13/2021     STUDIES: No results found.  ASSESSMENT: Anemia affecting pregnancy.  PLAN:    Anemia affecting pregnancy: Patient currently in her second trimester.  Her hemoglobin is mildly decreased at 10.1, but iron stores, folate, B12 are within normal limits.  She has been encouraged to continue to take oral iron supplementation.  No intervention is needed at this time.  Patient will return to clinic at the end of August as she enters her third trimester for repeat laboratory work and further evaluation. Pregnancy: Patient reports her due date is at the end of November.  I spent a total of 45 minutes reviewing chart data, face-to-face evaluation with the patient, counseling and coordination of care as detailed above.   Patient expressed understanding and was in agreement with this plan. She also understands that She can call clinic at any time with any questions, concerns, or complaints.    Jeralyn Ruths, MD   02/04/2021 3:37  PM

## 2021-02-03 ENCOUNTER — Ambulatory Visit: Payer: Medicaid Other

## 2021-02-03 ENCOUNTER — Encounter: Payer: Self-pay | Admitting: Oncology

## 2021-02-03 ENCOUNTER — Inpatient Hospital Stay: Payer: Medicaid Other

## 2021-02-03 ENCOUNTER — Other Ambulatory Visit: Payer: Self-pay

## 2021-02-03 ENCOUNTER — Inpatient Hospital Stay: Payer: Medicaid Other | Attending: Oncology | Admitting: Oncology

## 2021-02-03 VITALS — BP 97/64 | HR 93 | Temp 99.1°F | Resp 18 | Wt 132.6 lb

## 2021-02-03 DIAGNOSIS — D649 Anemia, unspecified: Secondary | ICD-10-CM | POA: Diagnosis not present

## 2021-02-03 DIAGNOSIS — O99012 Anemia complicating pregnancy, second trimester: Secondary | ICD-10-CM

## 2021-02-03 DIAGNOSIS — Z113 Encounter for screening for infections with a predominantly sexual mode of transmission: Secondary | ICD-10-CM

## 2021-02-03 DIAGNOSIS — O98119 Syphilis complicating pregnancy, unspecified trimester: Secondary | ICD-10-CM

## 2021-02-03 NOTE — Progress Notes (Signed)
Patient here today for initial evaluation regarding anemia in pregnancy.  

## 2021-02-03 NOTE — Progress Notes (Signed)
In Nurse Clinic for Bicillin injections for syphilis tx #3.  Bicillin 1.37mu IM given RUOQ and Bicillin 1.2 mu given IM LUOQ with minimal complaint. Bicillin order by C. [place], [place] dated 01/19/2021. Pt remained in agency for approx 15-20 min without prob. Next [facility] appt 02/10/2021, arrival 2:45pm, reminder given. Jerel Shepherd, RN

## 2021-02-10 ENCOUNTER — Other Ambulatory Visit: Payer: Self-pay

## 2021-02-10 ENCOUNTER — Ambulatory Visit: Payer: Medicaid Other | Admitting: Advanced Practice Midwife

## 2021-02-10 ENCOUNTER — Encounter: Payer: Self-pay | Admitting: Advanced Practice Midwife

## 2021-02-10 VITALS — BP 104/62 | HR 86 | Temp 97.8°F | Wt 148.6 lb

## 2021-02-10 DIAGNOSIS — Z3400 Encounter for supervision of normal first pregnancy, unspecified trimester: Secondary | ICD-10-CM

## 2021-02-10 DIAGNOSIS — O99019 Anemia complicating pregnancy, unspecified trimester: Secondary | ICD-10-CM

## 2021-02-10 DIAGNOSIS — F5089 Other specified eating disorder: Secondary | ICD-10-CM

## 2021-02-10 HISTORY — DX: Other specified eating disorder: F50.89

## 2021-02-10 LAB — HEMOGLOBIN, FINGERSTICK: Hemoglobin: 10.4 g/dL — ABNORMAL LOW (ref 11.1–15.9)

## 2021-02-10 NOTE — Progress Notes (Addendum)
Patient here for MH RV at 19 6/7. Patient aware of 02/22/2021 [employer] U/S. Needs Hgb today.Marland KitchenMarland KitchenBurt Knack, RN

## 2021-02-10 NOTE — Progress Notes (Signed)
   PRENATAL VISIT NOTE  Subjective:  April Zuniga is a 25 y.o. G1P0000 at [redacted]w[redacted]d being seen today for ongoing prenatal care.  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  Patient reports no complaints.  Contractions: Not present. Vag. Bleeding: None.  Movement: Present. Denies leaking of fluid/ROM.   The following portions of the patient's history were reviewed and updated as appropriate: allergies, current medications, past family history, past medical history, past social history, past surgical history and problem list. Problem list updated.  Objective:   Vitals:   02/10/21 1535  BP: 104/62  Pulse: 86  Temp: 97.8 F (36.6 C)  Weight: 148 lb 9.6 oz (67.4 kg)    Fetal Status: Fetal Heart Rate (bpm): 150 Fundal Height: 20 cm Movement: Present     General:  Alert, oriented and cooperative. Patient is in no acute distress.  Skin: Skin is warm and dry. No rash noted.   Cardiovascular: Normal heart rate noted  Respiratory: Normal respiratory effort, no problems with respiration noted  Abdomen: Soft, gravid, appropriate for gestational age.  Pain/Pressure: Absent     Pelvic: Cervical exam deferred        Extremities: Normal range of motion.  Edema: None  Mental Status: Normal mood and affect. Normal behavior. Normal judgment and thought content.   Assessment and Plan:  Pregnancy: G1P0000 at [redacted]w[redacted]d  1. Anemia affecting pregnancy, antepartum Kept apt with Dr. Orlie Dakin 02/03/21; has f/u apt 04/05/21 and instructed to continue FeSo4 BID with juice - Hemoglobin, fingerstick  2. Pica 4 glasses/day Counseled to stop  3. Supervision of normal first pregnancy, antepartum Reminded of 02/22/21  u/s  Not working Living with FOB Walking 2x/wk x 15-30 min 5 lb 9.6 oz (2.54 kg)    Preterm labor symptoms and general obstetric precautions including but not limited to vaginal bleeding, contractions, leaking of fluid and fetal movement were reviewed in detail with the patient. Please refer to After Visit Summary for other counseling recommendations.  No follow-ups on file.  Future Appointments  Date Time Provider Department Center  02/22/2021  1:00 PM [facility]  04/05/2021  1:45 PM [facility] None  04/05/2021  2:00 PM Finnegan, Tollie Pizza, MD [facility] None    Alberteen Spindle, CNM

## 2021-02-22 ENCOUNTER — Ambulatory Visit
Admission: RE | Admit: 2021-02-22 | Discharge: 2021-02-22 | Disposition: A | Discharge status: Home / Self Care | Payer: Medicaid Other | Source: Ambulatory Visit | Attending: Physician Assistant | Admitting: Physician Assistant

## 2021-02-22 ENCOUNTER — Other Ambulatory Visit: Payer: Self-pay

## 2021-02-22 DIAGNOSIS — Z3400 Encounter for supervision of normal first pregnancy, unspecified trimester: Secondary | ICD-10-CM

## 2021-03-03 ENCOUNTER — Encounter: Payer: Self-pay | Admitting: Physician Assistant

## 2021-03-03 DIAGNOSIS — O409XX0 Polyhydramnios, unspecified trimester, not applicable or unspecified: Secondary | ICD-10-CM | POA: Insufficient documentation

## 2021-03-10 ENCOUNTER — Ambulatory Visit: Payer: Medicaid Other

## 2021-03-14 ENCOUNTER — Ambulatory Visit: Payer: Medicaid Other

## 2021-03-14 ENCOUNTER — Telehealth: Payer: Self-pay

## 2021-03-14 NOTE — Telephone Encounter (Signed)
TC to patient who missed her MH RV this afternoon. Unable to LM as VM is full. TC to emergency contact, unable to LM, VM is full.Burt Knack, RN

## 2021-03-23 ENCOUNTER — Ambulatory Visit: Payer: Medicaid Other | Admitting: Advanced Practice Midwife

## 2021-03-23 ENCOUNTER — Other Ambulatory Visit: Payer: Self-pay

## 2021-03-23 VITALS — BP 102/60 | HR 81 | Temp 98.2°F | Wt 160.8 lb

## 2021-03-23 DIAGNOSIS — F1290 Cannabis use, unspecified, uncomplicated: Secondary | ICD-10-CM

## 2021-03-23 DIAGNOSIS — Z720 Tobacco use: Secondary | ICD-10-CM | POA: Diagnosis not present

## 2021-03-23 DIAGNOSIS — O0930 Supervision of pregnancy with insufficient antenatal care, unspecified trimester: Secondary | ICD-10-CM

## 2021-03-23 DIAGNOSIS — O99019 Anemia complicating pregnancy, unspecified trimester: Secondary | ICD-10-CM

## 2021-03-23 DIAGNOSIS — Z3402 Encounter for supervision of normal first pregnancy, second trimester: Secondary | ICD-10-CM | POA: Diagnosis not present

## 2021-03-23 DIAGNOSIS — Z91199 Patient's noncompliance with other medical treatment and regimen due to unspecified reason: Secondary | ICD-10-CM | POA: Insufficient documentation

## 2021-03-23 DIAGNOSIS — Z3400 Encounter for supervision of normal first pregnancy, unspecified trimester: Secondary | ICD-10-CM

## 2021-03-23 DIAGNOSIS — O0932 Supervision of pregnancy with insufficient antenatal care, second trimester: Secondary | ICD-10-CM

## 2021-03-23 DIAGNOSIS — Z9119 Patient's noncompliance with other medical treatment and regimen: Secondary | ICD-10-CM

## 2021-03-23 DIAGNOSIS — O409XX0 Polyhydramnios, unspecified trimester, not applicable or unspecified: Secondary | ICD-10-CM

## 2021-03-23 DIAGNOSIS — O98119 Syphilis complicating pregnancy, unspecified trimester: Secondary | ICD-10-CM

## 2021-03-23 DIAGNOSIS — F5089 Other specified eating disorder: Secondary | ICD-10-CM

## 2021-03-23 DIAGNOSIS — O09899 Supervision of other high risk pregnancies, unspecified trimester: Secondary | ICD-10-CM | POA: Insufficient documentation

## 2021-03-23 LAB — HEMOGLOBIN, FINGERSTICK: Hemoglobin: 10.4 g/dL — ABNORMAL LOW (ref 11.1–15.9)

## 2021-03-23 MED ORDER — FERROUS SULFATE 325 (65 FE) MG PO TABS: 325.0000 mg | ORAL_TABLET | Freq: Every day | ORAL | 3 refills | Status: DC

## 2021-03-23 NOTE — Progress Notes (Signed)
Hgb reviewed during clinic visit. Patient states she lost her iron bottle and not taking iron as prescribed and that it causes constipation. Hgb today = 10.4   Gave patient another bottle of iron and recommend patient to take iron with juice and add fiber to juice to help with constipation.   All questions answered.   Floy Sabina, RN

## 2021-03-23 NOTE — Progress Notes (Signed)
PRENATAL VISIT NOTE  Subjective:  April Zuniga is a 25 y.o. G1P0000 at [redacted]w[redacted]d being seen today for ongoing prenatal care.  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  Patient reports no complaints.  Contractions: Not present. Vag. Bleeding: None.  Movement: Present. Denies leaking of fluid/ROM.   The following portions of the patient's history were reviewed and updated as appropriate: allergies, current medications, past family history, past medical history, past social history, past surgical history and problem list. Problem list updated.  Objective:   Vitals:   03/23/21 1611  BP: 102/60  Pulse: 81  Temp: (!) 48.2 F (9 C)  Weight: 160 lb 12.8 oz (72.9 kg)    Fetal Status:     Movement: Present     General:  Alert, oriented and cooperative. Patient is in no acute distress.  Skin: Skin is warm and dry. No rash noted.   Cardiovascular: Normal heart rate noted  Respiratory: Normal respiratory effort, no problems with respiration noted  Abdomen: Soft, gravid, appropriate for gestational age.  Pain/Pressure: Absent     Pelvic: Cervical exam deferred        Extremities: Normal range of motion.  Edema: None  Mental Status: Normal mood and affect. Normal behavior. Normal judgment and thought content.   Assessment and Plan:  Pregnancy: G1P0000 at [redacted]w[redacted]d  1. Anemia affecting pregnancy, antepartum Kept consult apt with Dr. Orlie Dakin 02/03/21 and plans to keep f/u apt with him 04/05/21 Stopped taking FeSo4 6 wks ago because of constipation--suggestions given for constipation and iron rich foods and counseled  why needs to take FeSo4 BID with oj - Hemoglobin, fingerstick - ferrous sulfate 325 (65 FE) MG tablet; Take 1 tablet (325 mg total) by mouth daily with breakfast.  Dispense: 100 tablet; Refill: 3  2. Polyhydramnios affecting pregnancy 02/23/21 On 02/22/21 u/s at 22 6/7 F/u u/s ordered with MFM  3. Pica 4 glasses/day Pt states no longer eating ice  4. Nicotine vapor product user Pt states she has stopped  5. Late prenatal care 14 5/7   6. Supervision of normal first pregnancy, antepartum Was living with FOB in [place]; states now she is living with her mom, MGM, 46 yo sister; states she doesn't have a drivers license so depends on others to drive her Reviewed 3/32/95 u/s with anterior placenta, no previa, 3VC, normal anatomy, AFI>95% at 22 6/7 at 22 6/7  7. 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 Syphilis in pregnancy, antepartum treated 01/19/21 Treated 01/19/21  9. Noncompliance with prenatal care Last apt 6 wks ago, late entry to care, not taking FeSo4   Preterm labor symptoms and general obstetric precautions including but not limited to vaginal bleeding, contractions, leaking of fluid and fetal movement were reviewed in detail with the patient. Please refer to After Visit Summary for other counseling recommendations.  No follow-ups on file.  Future Appointments  Date Time Provider Department Center  04/05/2021  1:45 PM [facility] None  04/05/2021  2:00 PM Jeralyn Ruths, MD [facility] None  04/06/2021  2:20 PM [facility] PROVIDER [facility] None    Lanora Manis  A Azlaan Isidore, CNM

## 2021-03-23 NOTE — Progress Notes (Signed)
[place] faxed to [facility] with confirmation 03/23/21.   Floy Sabina, RN

## 2021-03-24 ENCOUNTER — Other Ambulatory Visit: Payer: Self-pay | Admitting: Advanced Practice Midwife

## 2021-03-24 ENCOUNTER — Telehealth: Payer: Self-pay | Admitting: Family Medicine

## 2021-03-24 DIAGNOSIS — Z3689 Encounter for other specified antenatal screening: Secondary | ICD-10-CM

## 2021-03-24 NOTE — Telephone Encounter (Signed)
Please call patient.  She has questions about size of the baby and when is her ultrasound

## 2021-03-24 NOTE — Telephone Encounter (Signed)
Call to client and questions answered. Notified of [facility] [place] appt on 04/21/2021 at 1 pm with MFM consult to follow (per [facility] in scheduling). Jossie Ng, RN

## 2021-03-31 NOTE — Progress Notes (Signed)
[facility]  [phone] [phone]  ID: April Zuniga OB: 1995-11-13  MR#: [id number]  BWI#:[id number]  Patient Care Team: Patient, No Pcp Per (Inactive) as PCP - General (General Practice)  CHIEF COMPLAINT: Anemia affecting pregnancy.  INTERVAL HISTORY: Patient returns to clinic today for repeat laboratory work and further evaluation.  She continues to have mild weakness and fatigue as well as intermittent nausea related to her pregnancy, but otherwise feels well.  She is gaining weight appropriately.  She is tolerating oral iron supplementation without significant side effects.  She has no neurologic complaints.  She denies any recent fevers or illnesses.  She has no chest pain, shortness of breath, cough, or hemoptysis.  She denies any nausea, vomiting, constipation, or diarrhea.  She has no urinary complaints.  Patient offers no further specific complaints today.  REVIEW OF SYSTEMS:   Review of Systems  Constitutional: Negative.  Negative for fever, malaise/fatigue and weight loss.  Respiratory: Negative.  Negative for cough, hemoptysis and shortness of breath.   Cardiovascular: Negative.  Negative for chest pain and leg swelling.  Gastrointestinal: Negative.  Negative for abdominal pain.  Genitourinary: Negative.  Negative for dysuria and hematuria.  Musculoskeletal: Negative.  Negative for back pain.  Skin: Negative.  Negative for rash.  Neurological: Negative.  Negative for dizziness, focal weakness, weakness and headaches.  Psychiatric/Behavioral: Negative.  The patient is not nervous/anxious.    As per HPI. Otherwise, a complete review of systems is negative.  PAST MEDICAL HISTORY: Past Medical History:  Diagnosis Date   Anemia affecting pregnancy 01/13/2021   Eczema    Otitis externa of right ear 03/26/2020   Pica 4 glasses/day 02/10/2021   Salivary stone 11/13/2018    PAST SURGICAL HISTORY: Past Surgical History:  Procedure  Laterality Date   NO PAST SURGERIES     PILONIDAL CYST DRAINAGE  12/16/2019    FAMILY HISTORY: Family History  Problem Relation Age of Onset   Healthy Mother    Asthma Father    Bipolar disorder Father    Healthy Sister    Healthy Sister    Healthy Sister    Asthma Brother    Healthy Brother    Healthy Brother    Bipolar disorder Paternal Aunt    Bipolar disorder Paternal Uncle    Diabetes Maternal Grandmother    Healthy Maternal Grandfather    Heart attack Paternal Grandmother     ADVANCED DIRECTIVES (Y/N):  N  HEALTH MAINTENANCE: Social History   Tobacco Use   Smoking status: Former    Types: Cigarettes, E-cigarettes    Quit date: 12/14/2018    Years since quitting: 2.3    Passive exposure: Current   Smokeless tobacco: Never  Vaping Use   Vaping Use: Every day  Substance Use Topics   Alcohol use: Not Currently   Drug use: Not Currently    Frequency: 4.0 times per week    Types: Marijuana    Comment: Decrease use since pregnancy "very minial every other day a few "puffs"     Colonoscopy:  PAP:  Bone density:  Lipid panel:  Allergies  Allergen Reactions   Fish Allergy Hives    Current Outpatient Medications  Medication Sig Dispense Refill   ferrous sulfate 325 (65 FE) MG tablet Take 1 tablet (325 mg total) by mouth daily with breakfast. 100 tablet 3   No current facility-administered medications for this visit.    OBJECTIVE: Vitals:   04/05/21 1420  BP: 107/67  Pulse: 67  Resp: 16  Temp: 98.4 F (36.9 C)  SpO2: 100%     Body mass index is 25.48 kg/m.    ECOG FS:0 - Asymptomatic  General: Well-developed, well-nourished, no acute distress. Eyes: Pink conjunctiva, anicteric sclera. HEENT: Normocephalic, moist mucous membranes. Lungs: No audible wheezing or coughing. Heart: Regular rate and rhythm. Abdomen: Appears appropriate for gestational age. Musculoskeletal: No edema, cyanosis, or clubbing. Neuro: Alert, answering all questions  appropriately. Cranial nerves grossly intact. Skin: No rashes or petechiae noted. Psych: Normal affect.    LAB RESULTS:  Lab Results  Component Value Date   NA 139 10/27/2017   K 3.9 10/27/2017   CL 105 10/27/2017   CO2 26 10/27/2017   GLUCOSE 89 10/27/2017   BUN 9 10/27/2017   CREATININE 0.81 10/27/2017   CALCIUM 9.4 10/27/2017   GFRNONAA >60 10/27/2017   GFRAA >60 10/27/2017    Lab Results  Component Value Date   WBC 10.1 04/05/2021   NEUTROABS 6.9 04/05/2021   HGB 10.7 (L) 04/05/2021   HCT 32.0 (L) 04/05/2021   MCV 92.5 04/05/2021   PLT 238 04/05/2021   Lab Results  Component Value Date   IRON 131 04/05/2021   TIBC 431 04/05/2021   IRONPCTSAT 30 04/05/2021   Lab Results  Component Value Date   FERRITIN 16 04/05/2021     STUDIES: No results found.  ASSESSMENT: Anemia affecting pregnancy.  PLAN:    Anemia affecting pregnancy: Patient is now in her third trimester.  Hemoglobin has improved to 10.7 and her iron stores are now within normal limit.  Previously, all of her other laboratory work was either negative or within normal limits.  No further intervention is needed at this time.  Patient does not require IV iron.  Return to clinic the first week of November prior to her due date for repeat laboratory work and further evaluation. Pregnancy: Patient reports her due date is at the end of November.  I spent a total of 20 minutes reviewing chart data, face-to-face evaluation with the patient, counseling and coordination of care as detailed above.    Patient expressed understanding and was in agreement with this plan. She also understands that She can call clinic at any time with any questions, concerns, or complaints.    Jeralyn Ruths, MD   04/06/2021 5:47 AM

## 2021-04-05 ENCOUNTER — Other Ambulatory Visit: Payer: Self-pay

## 2021-04-05 ENCOUNTER — Inpatient Hospital Stay: Payer: Medicaid Other | Attending: Oncology

## 2021-04-05 ENCOUNTER — Inpatient Hospital Stay (HOSPITAL_BASED_OUTPATIENT_CLINIC_OR_DEPARTMENT_OTHER): Payer: Medicaid Other | Admitting: Oncology

## 2021-04-05 VITALS — BP 107/67 | HR 67 | Temp 98.4°F | Resp 16 | Wt 162.7 lb

## 2021-04-05 DIAGNOSIS — O99013 Anemia complicating pregnancy, third trimester: Secondary | ICD-10-CM | POA: Diagnosis not present

## 2021-04-05 DIAGNOSIS — O99012 Anemia complicating pregnancy, second trimester: Secondary | ICD-10-CM

## 2021-04-05 DIAGNOSIS — D649 Anemia, unspecified: Secondary | ICD-10-CM | POA: Insufficient documentation

## 2021-04-05 LAB — CBC WITH DIFFERENTIAL/PLATELET
Abs Immature Granulocytes: 0.08 10*3/uL — ABNORMAL HIGH (ref 0.00–0.07)
Basophils Absolute: 0.1 10*3/uL (ref 0.0–0.1)
Basophils Relative: 1 %
Eosinophils Absolute: 0.4 10*3/uL (ref 0.0–0.5)
Eosinophils Relative: 4 %
HCT: 32 % — ABNORMAL LOW (ref 36.0–46.0)
Hemoglobin: 10.7 g/dL — ABNORMAL LOW (ref 12.0–15.0)
Immature Granulocytes: 1 %
Lymphocytes Relative: 20 %
Lymphs Abs: 2 10*3/uL (ref 0.7–4.0)
MCH: 30.9 pg (ref 26.0–34.0)
MCHC: 33.4 g/dL (ref 30.0–36.0)
MCV: 92.5 fL (ref 80.0–100.0)
Monocytes Absolute: 0.6 10*3/uL (ref 0.1–1.0)
Monocytes Relative: 6 %
Neutro Abs: 6.9 10*3/uL (ref 1.7–7.7)
Neutrophils Relative %: 68 %
Platelets: 238 10*3/uL (ref 150–400)
RBC: 3.46 MIL/uL — ABNORMAL LOW (ref 3.87–5.11)
RDW: 13.7 % (ref 11.5–15.5)
WBC: 10.1 10*3/uL (ref 4.0–10.5)
nRBC: 0 % (ref 0.0–0.2)

## 2021-04-05 LAB — VITAMIN B12: Vitamin B-12: 345 pg/mL (ref 180–914)

## 2021-04-05 LAB — IRON AND TIBC
Iron: 131 ug/dL (ref 28–170)
Saturation Ratios: 30 % (ref 10.4–31.8)
TIBC: 431 ug/dL (ref 250–450)
UIBC: 300 ug/dL

## 2021-04-05 LAB — FOLATE: Folate: 36 ng/mL (ref >5.9)

## 2021-04-05 LAB — FERRITIN: Ferritin: 16 ng/mL (ref 11–307)

## 2021-04-05 NOTE — Progress Notes (Signed)
Pt has no concerns at this time other than baby has "extra fluid around him" Pt has f/u scheduled with OB/GYN

## 2021-04-06 ENCOUNTER — Ambulatory Visit: Payer: Medicaid Other | Admitting: Advanced Practice Midwife

## 2021-04-06 VITALS — BP 109/67 | HR 81 | Temp 98.6°F | Wt 162.6 lb

## 2021-04-06 DIAGNOSIS — Z23 Encounter for immunization: Secondary | ICD-10-CM | POA: Diagnosis not present

## 2021-04-06 DIAGNOSIS — Z720 Tobacco use: Secondary | ICD-10-CM

## 2021-04-06 DIAGNOSIS — Z3400 Encounter for supervision of normal first pregnancy, unspecified trimester: Secondary | ICD-10-CM

## 2021-04-06 DIAGNOSIS — O409XX0 Polyhydramnios, unspecified trimester, not applicable or unspecified: Secondary | ICD-10-CM

## 2021-04-06 DIAGNOSIS — Z3403 Encounter for supervision of normal first pregnancy, third trimester: Secondary | ICD-10-CM

## 2021-04-06 LAB — HEMOGLOBIN, FINGERSTICK: Hemoglobin: 11 g/dL — ABNORMAL LOW (ref 11.1–15.9)

## 2021-04-06 NOTE — Progress Notes (Signed)
Hgb reviewed during clinic visit. Patient to continue taking iron daily.   Morgon Pamer, RN  

## 2021-04-06 NOTE — Progress Notes (Signed)
   PRENATAL VISIT NOTE  Subjective:  [name] is a 25 y.o. G1P0000 at [redacted]w[redacted]d being seen today for ongoing prenatal care.  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  Patient reports no complaints.  Contractions: Not present. Vag. Bleeding: None.  Movement: Present. Denies leaking of fluid/ROM.   The following portions of the patient's history were reviewed and updated as appropriate: allergies, current medications, past family history, past medical history, past social history, past surgical history and problem list. Problem list updated.  Objective:   Vitals:   04/06/21 1428  BP: 109/67  Pulse: 81  Temp: 98.6 F (37 C)  Weight: 162 lb 9.6 oz (73.8 kg)    Fetal Status: Fetal Heart Rate (bpm): 150 Fundal Height: 29 cm Movement: Present     General:  Alert, oriented and cooperative. Patient is in no acute distress.  Skin: Skin is warm and dry. No rash noted.   Cardiovascular: Normal heart rate noted  Respiratory: Normal respiratory effort, no problems with respiration noted  Abdomen: Soft, gravid, appropriate for gestational age.  Pain/Pressure: Absent     Pelvic: Cervical exam deferred        Extremities: Normal range of motion.  Edema: None  Mental Status: Normal mood and affect. Normal behavior. Normal judgment and thought content.   Assessment and Plan:  Pregnancy: G1P0000 at [redacted]w[redacted]d  1. Supervision of normal first pregnancy, antepartum 28 wk labs today Has diapers and clothes for baby. Living with mom and MGM. FOB recently lost his  job. Pt not working Reviewed 04/05/21 apt with Dr. Orlie Dakin and needs f/u apt 06/2021 Taking FeSo4 I daily with oj; states no more constipation because eating spinach and iron rich foods +UDS 01/13/21. Last MJ 4 days ago; agrees to UDS today and states will stop by 7 mo Vaping 2x/day; counseled via 5 A's to stop Stopped eating ice 19 lb 9.6 oz (8.891 kg) Walks on treadmill 2x/wk x 45 min - Hemoglobin, fingerstick - Glucose, 1 hour gestational - HIV-1/HIV-2 Qualitative RNA - RPR - Tdap vaccine greater than or equal to 7yo IM - 229798 Drug Screen  2. Polyhydramnios affecting pregnancy 02/22/21 Has f/u u/s 04/21/21   Preterm labor symptoms and general obstetric precautions including but not limited to vaginal bleeding, contractions, leaking of fluid and fetal movement were reviewed in detail with the patient. Please refer to After Visit Summary for other counseling recommendations.  Return in about 2 weeks (around 04/20/2021) for routine PNC.  Future Appointments  Date Time Provider Department Center  04/21/2021  1:00 PM [facility]  06/09/2021  2:00 PM [facility] None  06/09/2021  2:15 PM Finnegan, Tollie Pizza, MD [facility] None    Alberteen Spindle, CNM

## 2021-04-08 LAB — HIV-1/HIV-2 QUALITATIVE RNA
HIV-1 RNA, Qualitative: NONREACTIVE
HIV-2 RNA, Qualitative: NONREACTIVE

## 2021-04-09 LAB — RPR: RPR Ser Ql: REACTIVE — AB

## 2021-04-09 LAB — RPR, QUANT+TP ABS (REFLEX)
Rapid Plasma Reagin, Quant: 1:4 {titer} — ABNORMAL HIGH
T Pallidum Abs: REACTIVE — AB

## 2021-04-09 LAB — GLUCOSE, 1 HOUR GESTATIONAL: Gestational Diabetes Screen: 85 mg/dL (ref 65–139)

## 2021-04-13 LAB — 789231 7+OXYCODONE-BUND
Amphetamines, Urine: NEGATIVE ng/mL
BENZODIAZ UR QL: NEGATIVE ng/mL
Barbiturate screen, urine: NEGATIVE ng/mL
Cocaine (Metab.): NEGATIVE ng/mL
OPIATE SCREEN URINE: NEGATIVE ng/mL
Oxycodone/Oxymorphone, Urine: NEGATIVE ng/mL
PCP Quant, Ur: NEGATIVE ng/mL

## 2021-04-13 LAB — CANNABINOID CONFIRMATION, UR
CANNABINOIDS: POSITIVE — AB
Carboxy THC GC/MS Conf: 141 ng/mL

## 2021-04-19 ENCOUNTER — Other Ambulatory Visit: Payer: Self-pay

## 2021-04-19 ENCOUNTER — Other Ambulatory Visit: Payer: Self-pay | Admitting: Advanced Practice Midwife

## 2021-04-19 DIAGNOSIS — Z3689 Encounter for other specified antenatal screening: Secondary | ICD-10-CM

## 2021-04-19 DIAGNOSIS — O0933 Supervision of pregnancy with insufficient antenatal care, third trimester: Secondary | ICD-10-CM

## 2021-04-19 DIAGNOSIS — O403XX0 Polyhydramnios, third trimester, not applicable or unspecified: Secondary | ICD-10-CM

## 2021-04-19 DIAGNOSIS — O98113 Syphilis complicating pregnancy, third trimester: Secondary | ICD-10-CM

## 2021-04-20 ENCOUNTER — Other Ambulatory Visit: Payer: Self-pay

## 2021-04-20 ENCOUNTER — Ambulatory Visit: Payer: Medicaid Other | Admitting: Advanced Practice Midwife

## 2021-04-20 VITALS — BP 119/76 | HR 97 | Temp 97.8°F | Wt 161.0 lb

## 2021-04-20 DIAGNOSIS — Z3400 Encounter for supervision of normal first pregnancy, unspecified trimester: Secondary | ICD-10-CM

## 2021-04-20 DIAGNOSIS — O99012 Anemia complicating pregnancy, second trimester: Secondary | ICD-10-CM

## 2021-04-20 DIAGNOSIS — O99013 Anemia complicating pregnancy, third trimester: Secondary | ICD-10-CM

## 2021-04-20 DIAGNOSIS — Z720 Tobacco use: Secondary | ICD-10-CM

## 2021-04-20 DIAGNOSIS — Z3403 Encounter for supervision of normal first pregnancy, third trimester: Secondary | ICD-10-CM

## 2021-04-20 DIAGNOSIS — F1290 Cannabis use, unspecified, uncomplicated: Secondary | ICD-10-CM

## 2021-04-20 DIAGNOSIS — O409XX0 Polyhydramnios, unspecified trimester, not applicable or unspecified: Secondary | ICD-10-CM

## 2021-04-20 NOTE — Progress Notes (Signed)
[facility]  PRENATAL VISIT NOTE  Subjective:  April Zuniga is a 25 y.o. G1P0000 at [redacted]w[redacted]d being seen today for ongoing prenatal care.  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  Patient reports no complaints.  Contractions: Not present. Vag. Bleeding: None.  Movement: Present. Denies leaking of fluid/ROM.   The following portions of the patient's history were reviewed and updated as appropriate: allergies, current medications, past family history, past medical history, past social history, past surgical history and problem list. Problem list updated.  Objective:   Vitals:   04/20/21 1536  BP: 119/76  Pulse: 97  Temp: 97.8 F (36.6 C)  Weight: 161 lb (73 kg)    Fetal Status: Fetal Heart Rate (bpm): 140 Fundal Height: 30 cm Movement: Present     General:  Alert, oriented and cooperative. Patient is in no acute distress.  Skin: Skin is warm and dry. No rash noted.   Cardiovascular: Normal heart rate noted  Respiratory: Normal respiratory effort, no problems with respiration noted  Abdomen: Soft, gravid, appropriate for gestational age.  Pain/Pressure: Absent     Pelvic: Cervical exam deferred        Extremities: Normal range of motion.  Edema: None  Mental Status: Normal mood and affect. Normal behavior. Normal judgment and thought content.   Assessment and Plan:  Pregnancy: G1P0000 at [redacted]w[redacted]d  1. Supervision of normal first pregnancy, antepartum Reminded of f/u MFM u/s with consult  tomorrow 1 hour glucola=85 on 04/06/21 Walks on treadmill 3x/wk x 30 min Her mom moved back to [place] with pt's sister; living with MGM Will have 2 baby showers; no car seat yet Feels well 2. Anemia affecting pregnancy in second trimester Taking I FeSo4 daily with juice Has f/u apt with Dr. Orlie Dakin 06/09/21  3. Polyhydramnios affecting pregnancy 02/22/21 Dx'd 02/23/21. Has f/u u/s tomorrow with MFM + consult  4. Marijuana use; UDS +MJ 01/13/21; +MJ 04/06/21 States last use 04/10/21--pt praised +UDS 04/06/21  5. Nicotine vapor product user States last use 04/10/21--pt praised   Preterm labor symptoms and general obstetric precautions including but not limited to vaginal bleeding, contractions, leaking of fluid and fetal movement were reviewed in detail with the patient. Please refer to After Visit Summary for other counseling recommendations.  Return in about 2 weeks (around 05/04/2021) for routine PNC.  Future Appointments  Date Time Provider Department Center  04/21/2021  1:00 PM [facility]  06/09/2021  2:00 PM [facility] None  06/09/2021  2:15 PM Finnegan, Tollie Pizza, MD [facility] None    Alberteen Spindle, CNM

## 2021-04-20 NOTE — Progress Notes (Signed)
Aware of [facility] [place] appt tomorrow with arrival time of 1240. Correctly verbalizes how to take iron and PNV. Taking iron tablet with Vitamin C beverage. Jossie Ng, RN

## 2021-04-21 ENCOUNTER — Ambulatory Visit: Payer: Medicaid Other | Attending: Maternal & Fetal Medicine

## 2021-04-21 ENCOUNTER — Other Ambulatory Visit: Payer: Self-pay

## 2021-04-21 DIAGNOSIS — Z3A29 29 weeks gestation of pregnancy: Secondary | ICD-10-CM

## 2021-04-21 DIAGNOSIS — O0933 Supervision of pregnancy with insufficient antenatal care, third trimester: Secondary | ICD-10-CM | POA: Diagnosis not present

## 2021-04-21 DIAGNOSIS — O403XX0 Polyhydramnios, third trimester, not applicable or unspecified: Secondary | ICD-10-CM | POA: Diagnosis not present

## 2021-04-21 DIAGNOSIS — O98113 Syphilis complicating pregnancy, third trimester: Secondary | ICD-10-CM | POA: Insufficient documentation

## 2021-04-21 DIAGNOSIS — Z3689 Encounter for other specified antenatal screening: Secondary | ICD-10-CM | POA: Insufficient documentation

## 2021-04-21 DIAGNOSIS — O99013 Anemia complicating pregnancy, third trimester: Secondary | ICD-10-CM | POA: Insufficient documentation

## 2021-04-26 ENCOUNTER — Encounter: Payer: Self-pay | Admitting: Advanced Practice Midwife

## 2021-05-04 ENCOUNTER — Ambulatory Visit: Payer: Medicaid Other

## 2021-05-04 ENCOUNTER — Telehealth: Payer: Self-pay

## 2021-05-04 NOTE — Telephone Encounter (Signed)
TC to patient to reschedule missed appointment. Patient rescheduled for 05/10/2021.Burt Knack, RN

## 2021-05-10 ENCOUNTER — Ambulatory Visit: Payer: Medicaid Other | Admitting: Family Medicine

## 2021-05-10 ENCOUNTER — Other Ambulatory Visit: Payer: Self-pay

## 2021-05-10 VITALS — BP 112/69 | HR 74 | Temp 97.3°F | Wt 165.2 lb

## 2021-05-10 DIAGNOSIS — Z23 Encounter for immunization: Secondary | ICD-10-CM

## 2021-05-10 DIAGNOSIS — Z3403 Encounter for supervision of normal first pregnancy, third trimester: Secondary | ICD-10-CM

## 2021-05-10 DIAGNOSIS — Z3400 Encounter for supervision of normal first pregnancy, unspecified trimester: Secondary | ICD-10-CM

## 2021-05-10 DIAGNOSIS — O99013 Anemia complicating pregnancy, third trimester: Secondary | ICD-10-CM

## 2021-05-10 LAB — HEMOGLOBIN, FINGERSTICK: Hemoglobin: 11.7 g/dL (ref 11.1–15.9)

## 2021-05-10 NOTE — Progress Notes (Signed)
Correctly verbalizes how to take iron and prenatal vitamin. Taking iron with apple or orange juice. Hgb due today. Aware of 06/09/21 appt for labs / Dr. Orlie Dakin at 1400. Jossie Ng, RN Hgb = 11.7 and to continue iron and PNV as above. Jossie Ng, RN

## 2021-05-10 NOTE — Progress Notes (Signed)
[facility]  PRENATAL VISIT NOTE  Subjective:  April Zuniga is a 25 y.o. G1P0000 at 107w4d being seen today for ongoing prenatal care.  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  Patient reports no complaints.  Contractions: Irritability. Vag. Bleeding: None.  Movement: Present. Denies leaking of fluid/ROM.   The following portions of the patient's history were reviewed and updated as appropriate: allergies, current medications, past family history, past medical history, past social history, past surgical history and problem list. Problem list updated.  Objective:   Vitals:   05/10/21 1402  BP: 112/69  Pulse: 74  Temp: (!) 97.3 F (36.3 C)  Weight: 165 lb 3.2 oz (74.9 kg)    Fetal Status: Fetal Heart Rate (bpm): 141 Fundal Height: 33 cm Movement: Present     General:  Alert, oriented and cooperative. Patient is in no acute distress.  Skin: Skin is warm and dry. No rash noted.   Cardiovascular: Normal heart rate noted  Respiratory: Normal respiratory effort, no problems with respiration noted  Abdomen: Soft, gravid, appropriate for gestational age.  Pain/Pressure: Absent     Pelvic: Cervical exam deferred        Extremities: Normal range of motion.  Edema: None  Mental Status: Normal mood and affect. Normal behavior. Normal judgment and thought content.   Assessment and Plan:  Pregnancy: G1P0000 at [redacted]w[redacted]d  1. Anemia during pregnancy in third trimester -  taking Feso4 as directed  -Hgb rechecked today  -no longer eating ice  -pt aware of appointment with 06/09/21  - Hemoglobin, venipuncture  2. Supervision of normal first pregnancy, antepartum - pt has baby shower in [place] in 1 week and is not sure when she will have other d/t family moving to [facility].  -exercising, doing squats when unable to walk    Preterm labor symptoms and general obstetric precautions including but not limited to vaginal bleeding, contractions, leaking of fluid and fetal movement were reviewed in detail with the patient. Please refer to After Visit Summary for other counseling recommendations.  No follow-ups on file.  Future Appointments  Date Time Provider Department Center  06/09/2021  2:00 PM [facility] None  06/09/2021  2:15 PM Finnegan, Tollie Pizza, MD [facility] None    Wendi Snipes, FNP

## 2021-05-24 ENCOUNTER — Ambulatory Visit: Payer: Medicaid Other

## 2021-05-24 ENCOUNTER — Telehealth: Payer: Self-pay

## 2021-05-24 NOTE — Telephone Encounter (Signed)
TC to patient to reschedule missed MH RV. LM with number to call.Donis Kotowski Brewer-Jensen, RN  

## 2021-05-25 NOTE — Telephone Encounter (Signed)
Per Epic appt desk, client has [facility] RV scheduled for 05/31/2021. Jossie Ng, RN

## 2021-05-31 ENCOUNTER — Other Ambulatory Visit: Payer: Self-pay

## 2021-05-31 ENCOUNTER — Ambulatory Visit: Payer: Medicaid Other | Admitting: Family Medicine

## 2021-05-31 VITALS — BP 121/64 | HR 75 | Temp 97.0°F | Wt 166.8 lb

## 2021-05-31 DIAGNOSIS — Z3400 Encounter for supervision of normal first pregnancy, unspecified trimester: Secondary | ICD-10-CM

## 2021-05-31 DIAGNOSIS — O99013 Anemia complicating pregnancy, third trimester: Secondary | ICD-10-CM

## 2021-05-31 NOTE — Progress Notes (Signed)
[facility] M[facility] Health Clinic  PRENATAL VISIT NOTE  Subjective:  April Zuniga is a 25 y.o. G1P0000 at [redacted]w[redacted]d being seen today for ongoing prenatal care.  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  Patient reports no complaints.  Contractions: Irritability. Vag. Bleeding: None.  Movement: Present. Denies leaking of fluid/ROM.   The following portions of the patient's history were reviewed and updated as appropriate: allergies, current medications, past family history, past medical history, past social history, past surgical history and problem list. Problem list updated.  Objective:   Vitals:   05/31/21 1422  BP: 121/64  Pulse: 75  Temp: (!) 97 F (36.1 C)  Weight: 166 lb 12.8 oz (75.7 kg)    Fetal Status: Fetal Heart Rate (bpm): 140 Fundal Height: 35 cm Movement: Present     General:  Alert, oriented and cooperative. Patient is in no acute distress.  Skin: Skin is warm and dry. No rash noted.   Cardiovascular: Normal heart rate noted  Respiratory: Normal respiratory effort, no problems with respiration noted  Abdomen: Soft, gravid, appropriate for gestational age.  Pain/Pressure: Absent     Pelvic: Cervical exam deferred        Extremities: Normal range of motion.  Edema: None  Mental Status: Normal mood and affect. Normal behavior. Normal judgment and thought content.   Assessment and Plan:  Pregnancy: G1P0000 at [redacted]w[redacted]d  1. Supervision of normal first pregnancy,  antepartum -taking PNV and FeSo4 as directed  -baby ready, has to get car seat.  Got toddler seat by mistake -anticipated guidance- 36 weeks labs, change in visit frequency to weekly.    -reports exercising   2. Anemia during pregnancy in third trimester -Aware of appointment with hematology on 11/3  -Denies eating ice    Preterm labor symptoms and general obstetric precautions including but not limited to vaginal bleeding, contractions, leaking of fluid and fetal movement were reviewed in detail with the patient. Please refer to After Visit Summary for other counseling recommendations.  Return in about 1 week (around 06/07/2021) for 36 week labs.  Future Appointments  Date Time Provider Department Center  06/08/2021  3:20 PM [facility] PROVIDER [facility] None  06/09/2021  2:00 PM [facility] None  06/09/2021  2:15 PM Finnegan, Tollie Pizza, MD [facility] None    Wendi Snipes, FNP

## 2021-05-31 NOTE — Progress Notes (Signed)
Correctly verbalizes how to take PNV and iron. Taking iron with orange or apple juice. Aware of Dr. Orlie Dakin appt 110/10/2020 and also scheduled for lab appt on same day. Verified has [facility] contact card. Jossie Ng, RN

## 2021-06-03 NOTE — Progress Notes (Deleted)
[facility]  [phone]) [phone] [phone]) [phone]  ID: April Zuniga OB: [birth year]  MR#: [id number]  ASN#:[id number]  Patient Care Team: Patient, No Pcp Per (Inactive) as PCP - General (General Practice)  CHIEF COMPLAINT: Anemia affecting pregnancy.  INTERVAL HISTORY: Patient returns to clinic today for repeat laboratory work and further evaluation.  She continues to have mild weakness and fatigue as well as intermittent nausea related to her pregnancy, but otherwise feels well.  She is gaining weight appropriately.  She is tolerating oral iron supplementation without significant side effects.  She has no neurologic complaints.  She denies any recent fevers or illnesses.  She has no chest pain, shortness of breath, cough, or hemoptysis.  She denies any nausea, vomiting, constipation, or diarrhea.  She has no urinary complaints.  Patient offers no further specific complaints today.  REVIEW OF SYSTEMS:   Review of Systems  Constitutional: Negative.  Negative for fever, malaise/fatigue and weight loss.  Respiratory: Negative.  Negative for cough, hemoptysis and shortness of breath.   Cardiovascular: Negative.  Negative for chest pain and leg swelling.  Gastrointestinal: Negative.  Negative for abdominal pain.  Genitourinary: Negative.  Negative for dysuria and hematuria.  Musculoskeletal: Negative.  Negative for back pain.  Skin: Negative.  Negative for rash.  Neurological: Negative.  Negative for dizziness, focal weakness, weakness and headaches.  Psychiatric/Behavioral: Negative.  The patient is not nervous/anxious.    As per HPI. Otherwise, a complete review of systems is negative.  PAST MEDICAL HISTORY: Past Medical History:  Diagnosis Date   Anemia affecting pregnancy 01/13/2021   Eczema    Otitis externa of right ear 03/26/2020   Pica 4 glasses/day 02/10/2021   Salivary stone 11/13/2018    PAST SURGICAL HISTORY: Past Surgical History:  Procedure  Laterality Date   NO PAST SURGERIES     PILONIDAL CYST DRAINAGE  12/16/2019    FAMILY HISTORY: Family History  Problem Relation Age of Onset   Healthy Mother    Asthma Father    Bipolar disorder Father    Healthy Sister    Healthy Sister    Healthy Sister    Asthma Brother    Healthy Brother    Healthy Brother    Bipolar disorder Paternal Aunt    Bipolar disorder Paternal Uncle    Diabetes Maternal Grandmother    Healthy Maternal Grandfather    Heart attack Paternal Grandmother     ADVANCED DIRECTIVES (Y/N):  N  HEALTH MAINTENANCE: Social History   Tobacco Use   Smoking status: Former    Types: Cigarettes, E-cigarettes    Quit date: 12/14/2018    Years since quitting: 2.4    Passive exposure: Current   Smokeless tobacco: Never  Vaping Use   Vaping Use: Every day  Substance Use Topics   Alcohol use: Not Currently   Drug use: Not Currently    Frequency: 4.0 times per week    Types: Marijuana    Comment: Decrease use since pregnancy "very minial every other day a few "puffs"     Colonoscopy:  PAP:  Bone density:  Lipid panel:  Allergies  Allergen Reactions   Fish Allergy Hives    Current Outpatient Medications  Medication Sig Dispense Refill   ferrous sulfate 325 (65 FE) MG tablet Take 1 tablet (325 mg total) by mouth daily with breakfast. 100 tablet 3   Prenatal Vit-Fe Fumarate-FA (PRENATAL MULTIVITAMIN) TABS tablet Take 1 tablet by mouth daily at 12 noon.  No current facility-administered medications for this visit.    OBJECTIVE: There were no vitals filed for this visit.    There is no height or weight on file to calculate BMI.    ECOG FS:0 - Asymptomatic  General: Well-developed, well-nourished, no acute distress. Eyes: Pink conjunctiva, anicteric sclera. HEENT: Normocephalic, moist mucous membranes. Lungs: No audible wheezing or coughing. Heart: Regular rate and rhythm. Abdomen: Appears appropriate for gestational age. Musculoskeletal: No  edema, cyanosis, or clubbing. Neuro: Alert, answering all questions appropriately. Cranial nerves grossly intact. Skin: No rashes or petechiae noted. Psych: Normal affect.    LAB RESULTS:  Lab Results  Component Value Date   NA 139 10/27/2017   K 3.9 10/27/2017   CL 105 10/27/2017   CO2 26 10/27/2017   GLUCOSE 89 10/27/2017   BUN 9 10/27/2017   CREATININE 0.81 10/27/2017   CALCIUM 9.4 10/27/2017   GFRNONAA >60 10/27/2017   GFRAA >60 10/27/2017    Lab Results  Component Value Date   WBC 10.1 04/05/2021   NEUTROABS 6.9 04/05/2021   HGB 10.7 (L) 04/05/2021   HCT 32.0 (L) 04/05/2021   MCV 92.5 04/05/2021   PLT 238 04/05/2021   Lab Results  Component Value Date   IRON 131 04/05/2021   TIBC 431 04/05/2021   IRONPCTSAT 30 04/05/2021   Lab Results  Component Value Date   FERRITIN 16 04/05/2021     STUDIES: No results found.  ASSESSMENT: Anemia affecting pregnancy.  PLAN:    Anemia affecting pregnancy: Patient is now in her third trimester.  Hemoglobin has improved to 10.7 and her iron stores are now within normal limit.  Previously, all of her other laboratory work was either negative or within normal limits.  No further intervention is needed at this time.  Patient does not require IV iron.  Return to clinic the first week of November prior to her due date for repeat laboratory work and further evaluation. Pregnancy: Patient reports her due date is at the end of November.  I spent a total of 20 minutes reviewing chart data, face-to-face evaluation with the patient, counseling and coordination of care as detailed above.    Patient expressed understanding and was in agreement with this plan. She also understands that She can call clinic at any time with any questions, concerns, or complaints.    Jeralyn Ruths, MD   06/03/2021 10:02 AM

## 2021-06-08 ENCOUNTER — Other Ambulatory Visit: Payer: Self-pay | Admitting: Emergency Medicine

## 2021-06-08 ENCOUNTER — Ambulatory Visit: Payer: Medicaid Other | Admitting: Advanced Practice Midwife

## 2021-06-08 ENCOUNTER — Other Ambulatory Visit: Payer: Self-pay

## 2021-06-08 VITALS — BP 106/64 | HR 78 | Temp 98.0°F | Wt 168.6 lb

## 2021-06-08 DIAGNOSIS — O98119 Syphilis complicating pregnancy, unspecified trimester: Secondary | ICD-10-CM

## 2021-06-08 DIAGNOSIS — Z3403 Encounter for supervision of normal first pregnancy, third trimester: Secondary | ICD-10-CM

## 2021-06-08 DIAGNOSIS — Z3400 Encounter for supervision of normal first pregnancy, unspecified trimester: Secondary | ICD-10-CM

## 2021-06-08 DIAGNOSIS — O99013 Anemia complicating pregnancy, third trimester: Secondary | ICD-10-CM

## 2021-06-08 DIAGNOSIS — O99012 Anemia complicating pregnancy, second trimester: Secondary | ICD-10-CM

## 2021-06-08 DIAGNOSIS — Z720 Tobacco use: Secondary | ICD-10-CM | POA: Diagnosis not present

## 2021-06-08 DIAGNOSIS — F5089 Other specified eating disorder: Secondary | ICD-10-CM

## 2021-06-08 DIAGNOSIS — F1290 Cannabis use, unspecified, uncomplicated: Secondary | ICD-10-CM

## 2021-06-08 DIAGNOSIS — O98113 Syphilis complicating pregnancy, third trimester: Secondary | ICD-10-CM

## 2021-06-08 LAB — URINALYSIS
Bilirubin, UA: NEGATIVE
Glucose, UA: NEGATIVE
Ketones, UA: NEGATIVE
Nitrite, UA: NEGATIVE
Protein,UA: NEGATIVE
RBC, UA: NEGATIVE
Specific Gravity, UA: 1.02 (ref 1.005–1.030)
Urobilinogen, Ur: 0.2 mg/dL (ref 0.2–1.0)
pH, UA: 7 (ref 5.0–7.5)

## 2021-06-08 LAB — HEMOGLOBIN, FINGERSTICK: Hemoglobin: 11.8 g/dL (ref 11.1–15.9)

## 2021-06-08 NOTE — Progress Notes (Signed)
Hgb = 11.8 and to continue iron as prescribed. Urine dip reviewed by E. Sciora CNM and no new orders given. Due to syphilis diagnosis in pregnancy, RPR drawn today. Jossie Ng, RN

## 2021-06-08 NOTE — Progress Notes (Signed)
[facility]  PRENATAL VISIT NOTE  Subjective:  April Zuniga is a 25 y.o. G1P0000 at [redacted]w[redacted]d being seen today for ongoing prenatal care.  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  Patient reports no complaints.  Contractions: Not present. Vag. Bleeding: None.  Movement: Present. Denies leaking of fluid/ROM.   The following portions of the patient's history were reviewed and updated as appropriate: allergies, current medications, past family history, past medical history, past social history, past surgical history and problem list. Problem list updated.  Objective:   Vitals:   06/08/21 1539  BP: 106/64  Pulse: 78  Temp: 98 F (36.7 C)  Weight: 168 lb 9.6 oz (76.5 kg)    Fetal Status: Fetal Heart Rate (bpm): 150 Fundal Height: 37 cm Movement: Present  Presentation: Vertex  General:  Alert, oriented and cooperative. Patient is in no acute distress.  Skin: Skin is warm and dry. No rash noted.   Cardiovascular: Normal heart rate noted  Respiratory: Normal respiratory effort, no problems with respiration noted  Abdomen: Soft, gravid, appropriate for gestational age.  Pain/Pressure: Absent     Pelvic: Cervical exam deferred        Extremities: Normal range of motion.  Edema: None  Mental Status: Normal mood and affect. Normal behavior. Normal judgment and thought content.   Assessment and Plan:  Pregnancy: G1P0000 at [redacted]w[redacted]d  1. Supervision of normal first  pregnancy, antepartum 25 lb 9.6 oz (11.6 kg) Walking 7x/wk x 30 min Has car seat and ready for baby at home. Will have baby shower 06/12/21. Knows when to go to L&D GC/Chlamydia/GBS cultures collected Living with FOB now.  Mom is coming from [facility] 1 week before [facility] to be labor coach and help. Pt is anxious because her mom refuses to get flu shot, TDAP, Covid vaccine and she's worried her mom will get baby sick - Culture, beta strep (group b only) - Chlamydia/GC NAA, Confirmation - Hemoglobin, fingerstick - 562130 Drug Screen - Urinalysis (Urine Dip)  2. Anemia affecting pregnancy in third trimester Taking I FeSo4 daily with juice seperately from vits Plans to keep apt with heme Dr. Orlie Dakin 06/09/21  3. Marijuana use; UDS +MJ 01/13/21; +MJ 04/06/21 States hasn't used "in a long time" Agrees to UDS today  4. Nicotine vapor product user Last vaped today  5. Pica 4 glasses/day denies   Preterm labor symptoms and general obstetric precautions including but not limited to vaginal bleeding, contractions, leaking of fluid and fetal movement were reviewed in detail with the patient. Please refer to After Visit Summary for other counseling recommendations.  No follow-ups on file.  Future Appointments  Date Time Provider Department Center  06/09/2021  2:00 PM [facility] None  06/09/2021  2:15 PM Finnegan, Tollie Pizza, MD [facility] None    Alberteen Spindle, CNM

## 2021-06-08 NOTE — Progress Notes (Signed)
Patient here for MH RV at 36w 5d.   Patient desires provider collection of GBS and gc/chlamydia labs.   36 week packet given to patient.   PHQ9 = 8    Floy Sabina, RN

## 2021-06-09 ENCOUNTER — Other Ambulatory Visit: Payer: Self-pay

## 2021-06-09 ENCOUNTER — Inpatient Hospital Stay
Admission: EM | Admit: 2021-06-09 | Discharge: 2021-06-11 | DRG: 806 | Disposition: A | Discharge status: Home / Self Care | Payer: Medicaid Other | Attending: Obstetrics | Admitting: Obstetrics

## 2021-06-09 ENCOUNTER — Encounter: Payer: Self-pay | Admitting: Obstetrics and Gynecology

## 2021-06-09 ENCOUNTER — Inpatient Hospital Stay: Payer: Medicaid Other | Admitting: Registered Nurse

## 2021-06-09 ENCOUNTER — Inpatient Hospital Stay: Payer: Medicaid Other | Admitting: Oncology

## 2021-06-09 ENCOUNTER — Inpatient Hospital Stay: Payer: Medicaid Other

## 2021-06-09 DIAGNOSIS — F1290 Cannabis use, unspecified, uncomplicated: Secondary | ICD-10-CM | POA: Diagnosis present

## 2021-06-09 DIAGNOSIS — Z3A36 36 weeks gestation of pregnancy: Secondary | ICD-10-CM | POA: Diagnosis not present

## 2021-06-09 DIAGNOSIS — Z20822 Contact with and (suspected) exposure to covid-19: Secondary | ICD-10-CM | POA: Diagnosis present

## 2021-06-09 DIAGNOSIS — Z87891 Personal history of nicotine dependence: Secondary | ICD-10-CM

## 2021-06-09 DIAGNOSIS — O99013 Anemia complicating pregnancy, third trimester: Secondary | ICD-10-CM

## 2021-06-09 DIAGNOSIS — O0930 Supervision of pregnancy with insufficient antenatal care, unspecified trimester: Secondary | ICD-10-CM

## 2021-06-09 DIAGNOSIS — O26893 Other specified pregnancy related conditions, third trimester: Secondary | ICD-10-CM | POA: Diagnosis present

## 2021-06-09 DIAGNOSIS — O9081 Anemia of the puerperium: Secondary | ICD-10-CM | POA: Diagnosis not present

## 2021-06-09 DIAGNOSIS — D62 Acute posthemorrhagic anemia: Secondary | ICD-10-CM | POA: Diagnosis not present

## 2021-06-09 DIAGNOSIS — Z3400 Encounter for supervision of normal first pregnancy, unspecified trimester: Secondary | ICD-10-CM

## 2021-06-09 DIAGNOSIS — O409XX0 Polyhydramnios, unspecified trimester, not applicable or unspecified: Secondary | ICD-10-CM

## 2021-06-09 DIAGNOSIS — O99324 Drug use complicating childbirth: Secondary | ICD-10-CM | POA: Diagnosis present

## 2021-06-09 DIAGNOSIS — O98119 Syphilis complicating pregnancy, unspecified trimester: Secondary | ICD-10-CM

## 2021-06-09 LAB — 789231 7+OXYCODONE-BUND
Amphetamines, Urine: NEGATIVE ng/mL
BENZODIAZ UR QL: NEGATIVE ng/mL
Barbiturate screen, urine: NEGATIVE ng/mL
Cannabinoid Quant, Ur: NEGATIVE ng/mL
Cocaine (Metab.): NEGATIVE ng/mL
OPIATE SCREEN URINE: NEGATIVE ng/mL
Oxycodone/Oxymorphone, Urine: NEGATIVE ng/mL
PCP Quant, Ur: NEGATIVE ng/mL

## 2021-06-09 LAB — URINE DRUG SCREEN, QUALITATIVE (ARMC ONLY)
Amphetamines, Ur Screen: NOT DETECTED
Barbiturates, Ur Screen: NOT DETECTED
Benzodiazepine, Ur Scrn: NOT DETECTED
Cannabinoid 50 Ng, Ur ~~LOC~~: NOT DETECTED
Cocaine Metabolite,Ur ~~LOC~~: NOT DETECTED
MDMA (Ecstasy)Ur Screen: NOT DETECTED
Methadone Scn, Ur: NOT DETECTED
Opiate, Ur Screen: NOT DETECTED
Phencyclidine (PCP) Ur S: NOT DETECTED
Tricyclic, Ur Screen: NOT DETECTED

## 2021-06-09 LAB — TYPE AND SCREEN
ABO/RH(D): B POS
Antibody Screen: NEGATIVE

## 2021-06-09 LAB — CBC
HCT: 33.9 % — ABNORMAL LOW (ref 36.0–46.0)
Hemoglobin: 11.6 g/dL — ABNORMAL LOW (ref 12.0–15.0)
MCH: 31.3 pg (ref 26.0–34.0)
MCHC: 34.2 g/dL (ref 30.0–36.0)
MCV: 91.4 fL (ref 80.0–100.0)
Platelets: 173 10*3/uL (ref 150–400)
RBC: 3.71 MIL/uL — ABNORMAL LOW (ref 3.87–5.11)
RDW: 13.1 % (ref 11.5–15.5)
WBC: 10.7 10*3/uL — ABNORMAL HIGH (ref 4.0–10.5)
nRBC: 0 % (ref 0.0–0.2)

## 2021-06-09 LAB — CHLAMYDIA/NGC RT PCR (ARMC ONLY)
Chlamydia Tr: NOT DETECTED
N gonorrhoeae: NOT DETECTED

## 2021-06-09 LAB — RESP PANEL BY RT-PCR (FLU A&B, COVID) ARPGX2
Influenza A by PCR: NEGATIVE
Influenza B by PCR: NEGATIVE
SARS Coronavirus 2 by RT PCR: NEGATIVE

## 2021-06-09 LAB — RUPTURE OF MEMBRANE (ROM)PLUS: Rom Plus: POSITIVE

## 2021-06-09 LAB — RPR: RPR Ser Ql: NONREACTIVE

## 2021-06-09 LAB — GROUP B STREP BY PCR: Group B strep by PCR: NEGATIVE

## 2021-06-09 LAB — ABO/RH: ABO/RH(D): B POS

## 2021-06-09 MED ORDER — LIDOCAINE HCL (PF) 1 % IJ SOLN
30.0000 mL | INTRAMUSCULAR | Status: DC | PRN
  Filled 2021-06-09: qty 30

## 2021-06-09 MED ORDER — BENZOCAINE-MENTHOL 20-0.5 % EX AERO: 1.0000 "application " | INHALATION_SPRAY | CUTANEOUS | Status: DC | PRN

## 2021-06-09 MED ORDER — ACETAMINOPHEN 325 MG PO TABS: 650.0000 mg | ORAL_TABLET | ORAL | Status: DC | PRN

## 2021-06-09 MED ORDER — OXYTOCIN-SODIUM CHLORIDE 30-0.9 UT/500ML-% IV SOLN: 1.0000 m[IU]/min | INTRAVENOUS | Status: DC

## 2021-06-09 MED ORDER — MEDROXYPROGESTERONE ACETATE 150 MG/ML IM SUSP
150.0000 mg | INTRAMUSCULAR | Status: AC | PRN
  Administered 2021-06-11: 150 mg via INTRAMUSCULAR
  Filled 2021-06-09 (×2): qty 1

## 2021-06-09 MED ORDER — DIBUCAINE (PERIANAL) 1 % EX OINT: 1.0000 "application " | TOPICAL_OINTMENT | CUTANEOUS | Status: DC | PRN

## 2021-06-09 MED ORDER — LIDOCAINE HCL (PF) 1 % IJ SOLN
INTRAMUSCULAR | Status: AC
  Filled 2021-06-09: qty 30

## 2021-06-09 MED ORDER — BISACODYL 10 MG RE SUPP: 10.0000 mg | Freq: Every day | RECTAL | Status: DC | PRN

## 2021-06-09 MED ORDER — SODIUM CHLORIDE 0.9 % IV SOLN: 250.0000 mL | INTRAVENOUS | Status: DC | PRN

## 2021-06-09 MED ORDER — OXYTOCIN 10 UNIT/ML IJ SOLN
INTRAMUSCULAR | Status: AC
  Filled 2021-06-09: qty 2

## 2021-06-09 MED ORDER — SODIUM CHLORIDE 0.9% FLUSH
3.0000 mL | Freq: Two times a day (BID) | INTRAVENOUS | Status: DC
  Administered 2021-06-10: 3 mL via INTRAVENOUS

## 2021-06-09 MED ORDER — AMMONIA AROMATIC IN INHA
RESPIRATORY_TRACT | Status: AC
  Filled 2021-06-09: qty 10

## 2021-06-09 MED ORDER — IBUPROFEN 600 MG PO TABS
600.0000 mg | ORAL_TABLET | Freq: Four times a day (QID) | ORAL | Status: DC
  Administered 2021-06-09 – 2021-06-10 (×5): 600 mg via ORAL
  Filled 2021-06-09 (×5): qty 1

## 2021-06-09 MED ORDER — COCONUT OIL OIL
1.0000 "application " | TOPICAL_OIL | Status: DC | PRN
  Filled 2021-06-09 (×2): qty 120

## 2021-06-09 MED ORDER — OXYTOCIN-SODIUM CHLORIDE 30-0.9 UT/500ML-% IV SOLN
INTRAVENOUS | Status: AC
  Administered 2021-06-09: 2 m[IU]/min via INTRAVENOUS
  Filled 2021-06-09: qty 500

## 2021-06-09 MED ORDER — FENTANYL-BUPIVACAINE-NACL 0.5-0.125-0.9 MG/250ML-% EP SOLN
EPIDURAL | Status: DC | PRN
  Administered 2021-06-09: 12 mL/h via EPIDURAL

## 2021-06-09 MED ORDER — WITCH HAZEL-GLYCERIN EX PADS: 1.0000 "application " | MEDICATED_PAD | CUTANEOUS | Status: DC | PRN

## 2021-06-09 MED ORDER — FLEET ENEMA 7-19 GM/118ML RE ENEM: 1.0000 | ENEMA | Freq: Every day | RECTAL | Status: DC | PRN

## 2021-06-09 MED ORDER — OXYTOCIN BOLUS FROM INFUSION
333.0000 mL | Freq: Once | INTRAVENOUS | Status: AC
  Administered 2021-06-09: 333 mL via INTRAVENOUS

## 2021-06-09 MED ORDER — SOD CITRATE-CITRIC ACID 500-334 MG/5ML PO SOLN: 30.0000 mL | ORAL | Status: DC | PRN

## 2021-06-09 MED ORDER — SENNOSIDES-DOCUSATE SODIUM 8.6-50 MG PO TABS
2.0000 | ORAL_TABLET | ORAL | Status: DC
  Administered 2021-06-10: 2 via ORAL
  Filled 2021-06-09: qty 2

## 2021-06-09 MED ORDER — SODIUM CHLORIDE 0.9% FLUSH: 3.0000 mL | INTRAVENOUS | Status: DC | PRN

## 2021-06-09 MED ORDER — FENTANYL-BUPIVACAINE-NACL 0.5-0.125-0.9 MG/250ML-% EP SOLN
EPIDURAL | Status: AC
  Filled 2021-06-09: qty 250

## 2021-06-09 MED ORDER — BUTORPHANOL TARTRATE 1 MG/ML IJ SOLN
1.0000 mg | INTRAMUSCULAR | Status: DC | PRN
  Administered 2021-06-09: 1 mg via INTRAVENOUS
  Filled 2021-06-09: qty 1

## 2021-06-09 MED ORDER — ONDANSETRON HCL 4 MG/2ML IJ SOLN: 4.0000 mg | Freq: Four times a day (QID) | INTRAMUSCULAR | Status: DC | PRN

## 2021-06-09 MED ORDER — OXYTOCIN-SODIUM CHLORIDE 30-0.9 UT/500ML-% IV SOLN
2.5000 [IU]/h | INTRAVENOUS | Status: DC
  Administered 2021-06-09: 2.5 [IU]/h via INTRAVENOUS

## 2021-06-09 MED ORDER — SIMETHICONE 80 MG PO CHEW: 80.0000 mg | CHEWABLE_TABLET | ORAL | Status: DC | PRN

## 2021-06-09 MED ORDER — ACETAMINOPHEN 325 MG PO TABS
650.0000 mg | ORAL_TABLET | ORAL | Status: DC | PRN
  Administered 2021-06-10 (×2): 650 mg via ORAL
  Filled 2021-06-09 (×2): qty 2

## 2021-06-09 MED ORDER — ONDANSETRON HCL 4 MG PO TABS: 4.0000 mg | ORAL_TABLET | ORAL | Status: DC | PRN

## 2021-06-09 MED ORDER — ONDANSETRON HCL 4 MG/2ML IJ SOLN: 4.0000 mg | INTRAMUSCULAR | Status: DC | PRN

## 2021-06-09 MED ORDER — ZOLPIDEM TARTRATE 5 MG PO TABS: 5.0000 mg | ORAL_TABLET | Freq: Every evening | ORAL | Status: DC | PRN

## 2021-06-09 MED ORDER — OXYCODONE HCL 5 MG PO TABS: 5.0000 mg | ORAL_TABLET | ORAL | Status: DC | PRN

## 2021-06-09 MED ORDER — TERBUTALINE SULFATE 1 MG/ML IJ SOLN: 0.2500 mg | Freq: Once | INTRAMUSCULAR | Status: DC | PRN

## 2021-06-09 MED ORDER — BUPIVACAINE HCL (PF) 0.25 % IJ SOLN
INTRAMUSCULAR | Status: DC | PRN
Start: 2021-06-09 — End: 2021-06-09
  Administered 2021-06-09 (×2): 4 mL via EPIDURAL

## 2021-06-09 MED ORDER — DIPHENHYDRAMINE HCL 25 MG PO CAPS: 25.0000 mg | ORAL_CAPSULE | Freq: Four times a day (QID) | ORAL | Status: DC | PRN

## 2021-06-09 MED ORDER — LACTATED RINGERS IV SOLN
500.0000 mL | INTRAVENOUS | Status: DC | PRN
Start: 2021-06-09 — End: 2021-06-09

## 2021-06-09 MED ORDER — PRENATAL MULTIVITAMIN CH
1.0000 | ORAL_TABLET | Freq: Every day | ORAL | Status: DC
  Administered 2021-06-10: 1 via ORAL
  Filled 2021-06-09: qty 1

## 2021-06-09 MED ORDER — MISOPROSTOL 200 MCG PO TABS
ORAL_TABLET | ORAL | Status: AC
  Filled 2021-06-09: qty 4

## 2021-06-09 MED ORDER — LACTATED RINGERS IV SOLN: INTRAVENOUS | Status: DC

## 2021-06-09 MED ORDER — LIDOCAINE-EPINEPHRINE (PF) 1.5 %-1:200000 IJ SOLN
INTRAMUSCULAR | Status: DC | PRN
  Administered 2021-06-09: 3 mL via PERINEURAL

## 2021-06-09 NOTE — OB Triage Note (Signed)
Pt April Zuniga 25 y.o. presents to labor and delivery triage reporting LOF and ctx . Pt is a G1P0000 at [redacted]w[redacted]d . Pt reports contractions started at 0400 this morning and her water ruptured at 0625 and described the fluid as clear. Pt reports her contractions have decreased in pain since her water ruptured and feels only mild pelvic pressure with each contractions. Pt states positive fetal movement. External FM and TOCO applied to non-tender abdomen and assessing. Initial FHR 140 . Vital signs obtained and within normal limits. Provider notified of pt.

## 2021-06-09 NOTE — Discharge Summary (Signed)
Obstetrical Discharge Summary  Patient Name: April Zuniga DOB: [birth year] MRN: [id number]  Date of Admission: 06/09/2021 Date of Delivery: 06/09/21  Delivered by: Avelino Leeds, CNM  Date of Discharge: 06/11/2021  Primary OB: [facility] [id number] last menstrual period was 09/24/2020. EDC Estimated Date of Delivery: 07/01/21 Gestational Age at Delivery: [redacted]w[redacted]d   Antepartum complications:  - See below  Admitting Diagnosis: SROM Secondary Diagnosis: Patient Active Problem List   Diagnosis Date Noted   Normal labor and delivery 06/09/2021   Polyhydramnios affecting pregnancy 02/22/21--resolved on 04/21/21 [place] u/s 03/23/2021   Noncompliant pregnant patient (no prenatal care x 6 wks) 03/23/2021   AFI (amniotic fluid index) increased 03/03/2021   Pica 4 glasses/day 02/10/2021   Syphilis in pregnancy, antepartum treated 01/19/21 01/18/2021   Supervision of normal first pregnancy, antepartum 01/13/2021   Late prenatal care 14 5/7 01/13/2021   Marijuana use; UDS +MJ 01/13/21; +MJ 04/06/21 01/13/2021   Nicotine vapor product user currently 01/13/2021   Anemia affecting pregnancy 01/13/2021    Augmentation: Pitocin Complications: None Intrapartum complications/course: none Delivery Type: spontaneous vaginal delivery Anesthesia: epidural Placenta: spontaneous Laceration: 2nd degree vaginal, sulcus, bilateral periurethral Episiotomy: none Newborn Data: Live born female  Birth Weight: 6 lb 2.1 oz (2780 g) APGAR: 8, 9  Newborn Delivery   Birth date/time: 06/09/2021 18:06:00 Delivery type: Vaginal, Spontaneous      Postpartum Procedures: none Edinburgh:  Edinburgh Postnatal Depression Scale Screening Tool 06/09/2021  I have been able to laugh and see the funny side of things. 0  I have looked forward with enjoyment to things. 0  I have blamed myself unnecessarily when things went wrong. 1  I have been anxious or worried for no good reason. 0  I have felt scared or panicky for no  good reason. 3  Things have been getting on top of me. 1  I have been so unhappy that I have had difficulty sleeping. 1  I have felt sad or miserable. 0  I have been so unhappy that I have been crying. 0  The thought of harming myself has occurred to me. 0  Edinburgh Postnatal Depression Scale Total 6     Post partum course:  Patient had an uncomplicated postpartum course.  By time of discharge on PPD#2, her pain was controlled on oral pain medications; she had appropriate lochia and was ambulating, voiding without difficulty and tolerating regular diet.  She was deemed stable for discharge to home.     Discharge Physical Exam:  BP 120/87 (BP Location: Right Arm)   Pulse 83   Temp 98.2 F (36.8 C) (Oral)   Resp 16   Ht 5\' 6"  (1.676 m)   Wt 76.2 kg   LMP 09/24/2020   SpO2 98%   Breastfeeding Unknown   BMI 27.12 kg/m   General: NAD CV: RRR Pulm: CTABL, nl effort ABD: s/nd/nt, fundus firm and below the umbilicus Lochia: moderate Perineum:minimal edema/repair well approximated DVT Evaluation: LE non-ttp, no evidence of DVT on exam.  Hemoglobin  Date Value Ref Range Status  06/10/2021 9.8 (L) 12.0 - 15.0 g/dL Final  01/13/2021 10.1 (L) 11.1 - 15.9 g/dL Final   HCT  Date Value Ref Range Status  06/10/2021 28.3 (L) 36.0 - 46.0 % Final   Hematocrit  Date Value Ref Range Status  01/13/2021 30.2 (L) 34.0 - 46.6 % Final     Disposition: stable, discharge to home. Baby Feeding: breastmilk and formula Baby Disposition: home with mom  Rh Immune globulin given: N/A  Rubella vaccine given: N/A Varivax vaccine given: N/A Flu vaccine given in AP or PP setting: 05/10/21 Tdap vaccine given in AP or PP setting: 04/06/21  Contraception: Depo  Prenatal Labs: Blood type/Rh B pos  Antibody screen neg  Rubella Immune  Varicella Immune  RPR reactive, 1:8, TPA reactive (01/13/21) Non-reactive (06/08/21)  HBsAg Neg  HIV NR  GC neg  Chlamydia neg  Genetic screening cfDNA negative   1 hour GTT 85  3 hour GTT   GBS neg    Plan:  April Zuniga was discharged to home in good condition. Follow-up appointment with delivering provider in 6 weeks.  Discharge Medications: Allergies as of 06/11/2021       Reactions   Fish Allergy Hives        Medication List     TAKE these medications    acetaminophen 325 MG tablet Commonly known as: Tylenol Take 2 tablets (650 mg total) by mouth every 4 (four) hours as needed (for pain scale < 4).   benzocaine-Menthol 20-0.5 % Aero Commonly known as: DERMOPLAST Apply 1 application topically as needed for irritation (perineal discomfort).   ferrous sulfate 325 (65 FE) MG tablet Take 1 tablet (325 mg total) by mouth daily with breakfast.   ibuprofen 600 MG tablet Commonly known as: ADVIL Take 1 tablet (600 mg total) by mouth every 6 (six) hours.   prenatal multivitamin Tabs tablet Take 1 tablet by mouth daily at 12 noon.   senna-docusate 8.6-50 MG tablet Commonly known as: Senokot-S Take 2 tablets by mouth daily. Start taking on: June 12, 2021   witch hazel-glycerin pad Commonly known as: TUCKS Apply 1 application topically as needed for hemorrhoids (for pain).         Follow-up Information     [facility] DEPARTMENT Follow up in 6 week(s).   Contact information: [address] [place] [address]                Signed: Janyce Llanos, CNM 06/11/2021 8:52 AM

## 2021-06-09 NOTE — Plan of Care (Signed)
Transferred to Room 336. Alert and oriented with pleasant affect. Color good, skin w&d. Assessment WNL. Oriented to Room and Education initiated. Pt. Has been feeding Infant Formula. I asked her if she wished to Breast Feed at all. Pt. Stated she desires to feed Infant Pumped Breast Milk. I instructed Pt. In Benefits of Breast Feeding and importance of Breast Stimulation for Milk Supply. Pt. V/o and stated she wants to pump while in Hospital. Breast Pump set up for Patient and she refused assist in Pumping and stated she knew how to use the Pump. Pt. Is unable to bare weight right leg; therefor, Pt. Placed on [place] and pt instructed to not be up without assist and she v/o.

## 2021-06-09 NOTE — Anesthesia Procedure Notes (Signed)
Epidural Patient location during procedure: OB Start time: 06/09/2021 2:36 PM End time: 06/09/2021 2:40 PM  Staffing Anesthesiologist: Yevette Edwards, MD Resident/CRNA: Stormy Fabian, CRNA Performed: resident/CRNA   Preanesthetic Checklist Completed: patient identified, IV checked, site marked, risks and benefits discussed, surgical consent, monitors and equipment checked, pre-op evaluation and timeout performed  Epidural Patient position: sitting Prep: Betadine Patient monitoring: heart rate, continuous pulse ox and blood pressure Approach: midline Location: L4-L5 Injection technique: LOR air  Needle:  Needle type: Tuohy  Needle gauge: 18 G Needle length: 9 cm and 9 Needle insertion depth: 5 cm Catheter type: closed end flexible Catheter size: 20 Guage Catheter at skin depth: 10 cm Test dose: negative and 1.5% lidocaine with Epi 1:200 K  Assessment Sensory level: T10 Events: blood not aspirated, injection not painful, no injection resistance, no paresthesia and negative IV test  Additional Notes Pt's history reviewed and consent obtained as per OB consent Patient tolerated the insertion well without complications. Negative SATD, negative IVTD All VSS were obtained and monitored through OBIX and nursing protocols followed.Reason for block:procedure for pain

## 2021-06-09 NOTE — H&P (Signed)
OB History & Physical   History of Present Illness:   Chief Complaint: SROM  HPI:  April Zuniga is a 25 y.o. G1P0000 female at [redacted]w[redacted]d dated by LMP.  She presents to L&D for leaking of fluid  Reports active fetal movement  Contractions: denies  LOF/SROM: 0625 Vaginal bleeding: none  Factors complicating pregnancy:    Patient Active Problem List   Diagnosis Date Noted   Normal labor and delivery 06/09/2021   Polyhydramnios affecting pregnancy 02/22/21--resolved on 04/21/21 [facility] u/s 03/23/2021   Noncompliant pregnant patient (no prenatal care x 6 wks) 03/23/2021   AFI (amniotic fluid index) increased 03/03/2021   Pica 4 glasses/day 02/10/2021   Syphilis in pregnancy, antepartum treated 01/19/21 01/18/2021   Supervision of normal first pregnancy, antepartum 01/13/2021   Late prenatal care 14 5/7 01/13/2021   Marijuana use; UDS +MJ 01/13/21; +MJ 04/06/21 01/13/2021   Nicotine vapor product user currently 01/13/2021   Anemia affecting pregnancy 01/13/2021     Maternal Medical History:   Past Medical History:  Diagnosis Date   Anemia affecting pregnancy 01/13/2021   Eczema    Otitis externa of right ear 03/26/2020   Pica 4 glasses/day 02/10/2021   Salivary stone 11/13/2018    Past Surgical History:  Procedure Laterality Date   NO PAST SURGERIES     PILONIDAL CYST DRAINAGE  12/16/2019    Allergies  Allergen Reactions   Fish Allergy Hives    Prior to Admission medications   Medication Sig Start Date End Date Taking? Authorizing Provider  ferrous sulfate 325 (65 FE) MG tablet Take 1 tablet (325 mg total) by mouth daily with breakfast. 03/23/21  Yes Sciora, Austin Miles, CNM  Prenatal Vit-Fe Fumarate-FA (PRENATAL MULTIVITAMIN) TABS tablet Take 1 tablet by mouth daily at 12 noon.   Yes [provider]     Prenatal care site:  [facility]  Social History: She  reports that she quit smoking about 2 years ago. Her smoking use included cigarettes and e-cigarettes. She has been  exposed to tobacco smoke. She has never used smokeless tobacco. She reports that she does not currently use alcohol. She reports that she does not currently use drugs after having used the following drugs: Marijuana. Frequency: 4.00 times per week.  Family History: family history includes Asthma in her brother and father; Bipolar disorder in her father, paternal aunt, and paternal uncle; Diabetes in her maternal grandmother; Healthy in her brother, brother, maternal grandfather, mother, sister, sister, and sister; Heart attack in her paternal grandmother.   Review of Systems: A full review of systems was performed and negative except as noted in the HPI.     Physical Exam:  Vital Signs: BP 136/85 (BP Location: Left Arm)   Pulse 79   Temp 98.4 F (36.9 C) (Oral)   Resp 18   Ht 5\' 6"  (1.676 m)   Wt 76.2 kg   LMP 09/24/2020   SpO2 94%   BMI 27.12 kg/m  Physical Exam Constitutional:      Appearance: Normal appearance.  HENT:     Head: Normocephalic.  Cardiovascular:     Rate and Rhythm: Normal rate and regular rhythm.     Pulses: Normal pulses.     Heart sounds: Normal heart sounds.  Abdominal:     Palpations: Abdomen is soft.  Musculoskeletal:        General: Normal range of motion.     Cervical back: Normal range of motion and neck supple.  Skin:    General:  Skin is warm.  Neurological:     Mental Status: She is alert and oriented to person, place, and time.  Psychiatric:        Mood and Affect: Mood normal.        Behavior: Behavior normal.        Thought Content: Thought content normal.        Judgment: Judgment normal.    General: no acute distress.  HEENT: normocephalic, atraumatic Heart: regular rate & rhythm.  No murmurs/rubs/gallops Lungs: clear to auscultation bilaterally, normal respiratory effort Abdomen: soft, gravid, non-tender;  EFW: 7lbs Pelvic:   External: Normal external female genitalia  Cervix: Dilation 3 / Effacement %80 / Station: -1, 0     Extremities: non-tender, symmetric, no edema bilaterally.  DTRs: +2  Neurologic: Alert & oriented x 3.    Results for orders placed or performed during the hospital encounter of 06/09/21 (from the past 24 hour(s))  ROM Plus ([facility] only)     Status: None   Collection Time: 06/09/21  7:56 AM  Result Value Ref Range   Rom Plus POSITIVE   Group B strep by PCR     Status: None   Collection Time: 06/09/21  7:56 AM   Specimen: Genital  Result Value Ref Range   Group B strep by PCR NEGATIVE NEGATIVE  Chlamydia/NGC rt PCR ([place] only)     Status: None   Collection Time: 06/09/21  7:56 AM  Result Value Ref Range   Specimen source GC/Chlam URINE, RANDOM    Chlamydia Tr NOT DETECTED NOT DETECTED   N gonorrhoeae NOT DETECTED NOT DETECTED  Urine Drug Screen, Qualitative ([facility] only)     Status: None   Collection Time: 06/09/21  7:56 AM  Result Value Ref Range   Tricyclic, Ur Screen NONE DETECTED NONE DETECTED   Amphetamines, Ur Screen NONE DETECTED NONE DETECTED   MDMA (Ecstasy)Ur Screen NONE DETECTED NONE DETECTED   Cocaine Metabolite,Ur [place] NONE DETECTED NONE DETECTED   Opiate, Ur Screen NONE DETECTED NONE DETECTED   Phencyclidine (PCP) Ur S NONE DETECTED NONE DETECTED   Cannabinoid 50 Ng, Ur [place] NONE DETECTED NONE DETECTED   Barbiturates, Ur Screen NONE DETECTED NONE DETECTED   Benzodiazepine, Ur Scrn NONE DETECTED NONE DETECTED   Methadone Scn, Ur NONE DETECTED NONE DETECTED  Resp Panel by RT-PCR (Flu A&B, Covid) Nasopharyngeal Swab     Status: None   Collection Time: 06/09/21  9:57 AM   Specimen: Nasopharyngeal Swab; Nasopharyngeal(NP) swabs in vial transport medium  Result Value Ref Range   SARS Coronavirus 2 by RT PCR NEGATIVE NEGATIVE   Influenza A by PCR NEGATIVE NEGATIVE   Influenza B by PCR NEGATIVE NEGATIVE  CBC     Status: Abnormal   Collection Time: 06/09/21  9:57 AM  Result Value Ref Range   WBC 10.7 (H) 4.0 - 10.5 K/uL   RBC 3.71 (L) 3.87 - 5.11 MIL/uL   Hemoglobin 11.6  (L) 12.0 - 15.0 g/dL   HCT 33.9 (L) 36.0 - 46.0 %   MCV 91.4 80.0 - 100.0 fL   MCH 31.3 26.0 - 34.0 pg   MCHC 34.2 30.0 - 36.0 g/dL   RDW 13.1 11.5 - 15.5 %   Platelets 173 150 - 400 K/uL   nRBC 0.0 0.0 - 0.2 %  Type and screen [facility]     Status: None   Collection Time: 06/09/21  9:57 AM  Result Value Ref Range   ABO/RH(D) B POS  Antibody Screen NEG    Sample Expiration      06/12/2021,2359 Performed at [facility], [address] [place], [place] [address]   ABO/Rh     Status: None   Collection Time: 06/09/21 12:31 PM  Result Value Ref Range   ABO/RH(D)      B POS Performed at [facility], [address] [place], [place] [address]     Pertinent Results:  Prenatal Labs: Blood type/Rh B Pos  Antibody screen neg  Rubella Immune  Varicella Immune  RPR NR  HBsAg Neg  HIV NR  GC neg  Chlamydia neg  Genetic screening cfDNA negative   1 hour GTT 85  3 hour GTT   GBS neg   FHT:  FHR: 135 bpm, variability: moderate,  accelerations:  Present,  decelerations:  Absent Category/reactivity:  Category I UC:   none, irritability   Cephalic by [place] and SVE   No results found.  Assessment:  SHERVON SEGERS is a 25 y.o. G1P0000 female at [redacted]w[redacted]d with .   Plan:  1. Admit to Labor & Delivery; consents reviewed and obtained - Covid admission screen   2. Fetal Well being  - Fetal Tracing: Category 1 - Group B Streptococcus ppx not indicated: GBS neg - Presentation: cephalic confirmed by SVE   3. Routine OB: - Prenatal labs reviewed, as above - Rh Pos - CBC, T&S, RPR on admit - Clear fluids, IVF  4. Monitoring of labor  - Contractions monitored with external toco - Plan for expectant management  - Augmentation with oxytocin and AROM as appropriate  - Plan for  continuous fetal monitoring - Maternal pain control as desired; planning regional anesthesia, IVPM, position changes , birth ball, and unmedicated labor  support options  - Anticipate vaginal delivery  5. Post Partum Planning: - Infant feeding: breast/bottle - Contraception: Depo - Tdap vaccine: 04/06/21 - Flu vaccine: 0000000   Avelino Leeds, CNM Certified Nurse Midwife [facility]

## 2021-06-09 NOTE — Anesthesia Preprocedure Evaluation (Signed)
Anesthesia Evaluation  Patient identified by MRN, date of birth, ID band Patient awake    Reviewed: Allergy & Precautions, H&P , NPO status , Patient's Chart, lab work & pertinent test results  History of Anesthesia Complications Negative for: history of anesthetic complications  Airway Mallampati: II  TM Distance: >3 FB Neck ROM: full    Dental no notable dental hx.    Pulmonary former smoker,    Pulmonary exam normal        Cardiovascular negative cardio ROS Normal cardiovascular exam     Neuro/Psych PSYCHIATRIC DISORDERS Anxiety negative neurological ROS     GI/Hepatic Neg liver ROS,   Endo/Other  negative endocrine ROS  Renal/GU negative Renal ROS  negative genitourinary   Musculoskeletal   Abdominal   Peds  Hematology  (+) Blood dyscrasia, anemia ,   Anesthesia Other Findings   Reproductive/Obstetrics (+) Pregnancy                             Anesthesia Physical Anesthesia Plan  ASA: 2  Anesthesia Plan: Epidural   Post-op Pain Management:    Induction:   PONV Risk Score and Plan:   Airway Management Planned:   Additional Equipment:   Intra-op Plan:   Post-operative Plan:   Informed Consent: I have reviewed the patients History and Physical, chart, labs and discussed the procedure including the risks, benefits and alternatives for the proposed anesthesia with the patient or authorized representative who has indicated his/her understanding and acceptance.       Plan Discussed with: Anesthesiologist  Anesthesia Plan Comments:         Anesthesia Quick Evaluation

## 2021-06-10 LAB — CBC
HCT: 28.3 % — ABNORMAL LOW (ref 36.0–46.0)
Hemoglobin: 9.8 g/dL — ABNORMAL LOW (ref 12.0–15.0)
MCH: 31.8 pg (ref 26.0–34.0)
MCHC: 34.6 g/dL (ref 30.0–36.0)
MCV: 91.9 fL (ref 80.0–100.0)
Platelets: 171 10*3/uL (ref 150–400)
RBC: 3.08 MIL/uL — ABNORMAL LOW (ref 3.87–5.11)
RDW: 13 % (ref 11.5–15.5)
WBC: 13.9 10*3/uL — ABNORMAL HIGH (ref 4.0–10.5)
nRBC: 0 % (ref 0.0–0.2)

## 2021-06-10 LAB — RPR
RPR Ser Ql: REACTIVE — AB
RPR Titer: 1:1 {titer}

## 2021-06-10 LAB — CHLAMYDIA/GC NAA, CONFIRMATION
Chlamydia trachomatis, NAA: NEGATIVE
Neisseria gonorrhoeae, NAA: NEGATIVE

## 2021-06-10 NOTE — Progress Notes (Signed)
Post Partum Day 1 Subjective: Doing well, no complaints.  Tolerating regular diet, pain with PO meds, voiding and ambulating without difficulty.  No CP SOB Fever,Chills, N/V or leg pain; denies nipple or breast pain, no HA change of vision, RUQ/epigastric pain  Objective: BP 123/81   Pulse 66   Temp 98.4 F (36.9 C) (Oral)   Resp 20   Ht 5\' 6"  (1.676 m)   Wt 76.2 kg   LMP 09/24/2020   SpO2 99%   Breastfeeding Unknown   BMI 27.12 kg/m    Physical Exam:  General: NAD Breasts: soft/nontender CV: RRR Pulm: nl effort, CTABL Abdomen: soft, NT, BS x 4 Perineum: minimal edema, repair well approximated Lochia: small Uterine Fundus: fundus firm and 2 fb below umbilicus DVT Evaluation: no cords, ttp LEs   Recent Labs    06/09/21 0957 06/10/21 0426  HGB 11.6* 9.8*  HCT 33.9* 28.3*  WBC 10.7* 13.9*  PLT 173 171    Assessment/Plan: 25 y.o. G1P0101 postpartum day # 1  - Continue routine PP care - Lactation consult prn - Discussed contraceptive options including implant, IUDs hormonal and non-hormonal, injection, pills/ring/patch, condoms, and NFP. Pt desires Depo prior to hospital DC - Acute blood loss anemia - hemodynamically stable and asymptomatic; start po ferrous sulfate BID with stool softeners  - mild elevation of WBCs, afebrile, will repeat CBC in AM.  - Immunization status: all Imms up to date    Disposition: Does not desire Dc home today.     13/04/22, CNM 06/10/2021  10:56 AM

## 2021-06-10 NOTE — Progress Notes (Signed)
Pt. Pumped 30cc. Instructed to feed Infant this Pumped Breast Milk at next feeding. Mom v/o.

## 2021-06-10 NOTE — Lactation Note (Signed)
This note was copied from a baby's chart. Lactation Consultation Note  Patient Name: April Zuniga [id number] Date: 06/10/2021 Reason for consult: Initial assessment;Primapara;Late-preterm 34-36.6wks Age:23 hours  Maternal Data Does the patient have breastfeeding experience prior to this delivery?: No  Feeding Mother's Current Feeding Choice: Breast Milk Nipple Type: Slow - flow Mom states she wants to pump and bottlefeed her EBM, states that baby does not like formula, states that she is pumping 15-30 cc colostrum each session, states baby "likes her milk" LATCH Score                    Lactation Tools Discussed/Used Tools: Pump Breast pump type: Double-Electric Breast Pump Pump Education: Setup, frequency, and cleaning;Milk Storage Reason for Pumping: plans to pump and bottlefeed breast milk Pumping frequency: q3h Pumped volume: 15 mL  Interventions Interventions: DEBP;Education;Coconut oil  Discharge Pump: Personal WIC Program: Yes She has a medela breast pump at home to pump with Consult Status Consult Status: PRN    Dyann Kief 06/10/2021, 3:53 PM

## 2021-06-10 NOTE — Anesthesia Postprocedure Evaluation (Signed)
Anesthesia Post Note  Patient: April Zuniga  Procedure(s) Performed: AN AD HOC LABOR EPIDURAL  Patient location during evaluation: Mother Baby Anesthesia Type: Epidural Level of consciousness: awake and alert Pain management: pain level controlled Vital Signs Assessment: post-procedure vital signs reviewed and stable Respiratory status: spontaneous breathing, nonlabored ventilation and respiratory function stable Cardiovascular status: stable Postop Assessment: no headache, no backache and epidural receding Anesthetic complications: no   No notable events documented.   Last Vitals:  Vitals:   06/10/21 0344 06/10/21 0840  BP: 114/80 123/81  Pulse:  66  Resp: 20   Temp: 36.7 C 36.9 C  SpO2:      Last Pain:  Vitals:   06/10/21 0840  TempSrc: Oral  PainSc: 1                  [name] [name]

## 2021-06-11 MED ORDER — ACETAMINOPHEN 325 MG PO TABS: 650.0000 mg | ORAL_TABLET | ORAL | Status: DC | PRN

## 2021-06-11 MED ORDER — SENNOSIDES-DOCUSATE SODIUM 8.6-50 MG PO TABS: 2.0000 | ORAL_TABLET | ORAL | Status: DC

## 2021-06-11 MED ORDER — BENZOCAINE-MENTHOL 20-0.5 % EX AERO: 1.0000 "application " | INHALATION_SPRAY | CUTANEOUS | Status: DC | PRN

## 2021-06-11 MED ORDER — WITCH HAZEL-GLYCERIN EX PADS: 1.0000 "application " | MEDICATED_PAD | CUTANEOUS | 12 refills | Status: DC | PRN

## 2021-06-11 MED ORDER — IBUPROFEN 600 MG PO TABS: 600.0000 mg | ORAL_TABLET | Freq: Four times a day (QID) | ORAL | 0 refills | Status: DC

## 2021-06-11 NOTE — Progress Notes (Signed)
Patient discharged with infant. Discharge instructions, prescriptions, and follow up appointments given to and reviewed with patient. Patient verbalized understanding. Will be escorted out by axillary.  °

## 2021-06-12 LAB — CULTURE, BETA STREP (GROUP B ONLY): Strep Gp B Culture: NEGATIVE

## 2021-06-13 ENCOUNTER — Telehealth: Payer: Self-pay | Admitting: Family Medicine

## 2021-06-13 LAB — T.PALLIDUM AB, TOTAL: T Pallidum Abs: REACTIVE — AB

## 2021-06-13 NOTE — Telephone Encounter (Signed)
Pt stated that she gave birth on 06/09/2021. She stated that she was breastfeeding in the hospital but stopped breastfeeding when she came home. Her breasts are swollen and painful and she wants advice about what she can do for the pain.

## 2021-06-13 NOTE — Telephone Encounter (Signed)
TC to patient returning her call. Patient states she has already pumped and relieved engorgement and feels much better. Patient counseled to use warm, moist compresses on her breasts and/or warm shower before pumping to relieve pressure. Counseled to go to the ED if she develops any hard, red, swollen lumps in her breasts, or breast pain with fever. Patient states understanding.Burt Knack, RN

## 2021-06-14 NOTE — Telephone Encounter (Signed)
Consulted on the plan of care for this client.  I agree with the documented note and actions taken to provide care for this client.  E. Tyshun Tuckerman, CNM  

## 2021-06-15 ENCOUNTER — Ambulatory Visit: Payer: Medicaid Other

## 2021-09-21 ENCOUNTER — Ambulatory Visit: Payer: Medicaid Other

## 2021-10-23 ENCOUNTER — Encounter (HOSPITAL_COMMUNITY): Payer: Self-pay | Admitting: Emergency Medicine

## 2021-10-23 ENCOUNTER — Ambulatory Visit (HOSPITAL_COMMUNITY)
Admission: EM | Admit: 2021-10-23 | Discharge: 2021-10-23 | Disposition: A | Discharge status: Home / Self Care | Payer: Medicaid Other | Attending: Emergency Medicine | Admitting: Emergency Medicine

## 2021-10-23 DIAGNOSIS — L0501 Pilonidal cyst with abscess: Secondary | ICD-10-CM

## 2021-10-23 MED ORDER — TRIAMCINOLONE ACETONIDE 0.1 % EX CREA
1.0000 | TOPICAL_CREAM | Freq: Two times a day (BID) | CUTANEOUS | 0 refills | Status: AC
Start: 2021-10-23 — End: ?

## 2021-10-23 MED ORDER — DOXYCYCLINE HYCLATE 100 MG PO CAPS: 100.0000 mg | ORAL_CAPSULE | Freq: Two times a day (BID) | ORAL | 0 refills | Status: AC

## 2021-10-23 NOTE — ED Provider Notes (Signed)
[facility]    CSN: [id number] Arrival date & time: 10/23/21  1358      History   Chief Complaint Chief Complaint  Patient presents with   Abscess    HPI April Zuniga is a 26 y.o. female.   Patient presents with abscess to the right buttocks for 2 weeks, pain, tenderness and swelling began 1 week ago.  Endorses that abscess has become pimple-like but has not drained.  Has not attempted treatment of symptoms.  Denies fever or chills.  Past Medical History:  Diagnosis Date   Anemia affecting pregnancy 01/13/2021   Eczema    Otitis externa of right ear 03/26/2020   Pica 4 glasses/day 02/10/2021   Salivary stone 11/13/2018    Patient Active Problem List   Diagnosis Date Noted   Normal labor and delivery 06/09/2021   Polyhydramnios affecting pregnancy 02/22/21--resolved on 04/21/21 [facility] u/s 03/23/2021   Noncompliant pregnant patient (no prenatal care x 6 wks) 03/23/2021   AFI (amniotic fluid index) increased 03/03/2021   Pica 4 glasses/day 02/10/2021   Syphilis in pregnancy, antepartum treated 01/19/21 01/18/2021   Supervision of normal first pregnancy, antepartum 01/13/2021   Late prenatal care 14 5/7 01/13/2021   Marijuana use; UDS +MJ 01/13/21; +MJ 04/06/21 01/13/2021   Nicotine vapor product user currently 01/13/2021   Anemia affecting pregnancy 01/13/2021    Past Surgical History:  Procedure Laterality Date   NO PAST SURGERIES     PILONIDAL CYST DRAINAGE  12/16/2019    OB History     Gravida  1   Para  1   Term  0   Preterm  1   AB  0   Living  1      SAB  0   IAB      Ectopic  0   Multiple  0   Live Births  1            Home Medications    Prior to Admission medications   Medication Sig Start Date End Date Taking? Authorizing Provider  doxycycline (VIBRAMYCIN) 100 MG capsule Take 1 capsule (100 mg total) by mouth 2 (two) times daily. 10/23/21  Yes Jennel Mara R, NP  triamcinolone cream (KENALOG) 0.1 % Apply 1 application.  topically 2 (two) times daily. 10/23/21  Yes Makenzie Weisner, Elita Boone, NP  acetaminophen (TYLENOL) 325 MG tablet Take 2 tablets (650 mg total) by mouth every 4 (four) hours as needed (for pain scale < 4). 06/11/21   Janyce Llanos, CNM  ibuprofen (ADVIL) 600 MG tablet Take 1 tablet (600 mg total) by mouth every 6 (six) hours. 06/11/21   Janyce Llanos, CNM    Family History Family History  Problem Relation Age of Onset   Healthy Mother    Asthma Father    Bipolar disorder Father    Healthy Sister    Healthy Sister    Healthy Sister    Asthma Brother    Healthy Brother    Healthy Brother    Bipolar disorder Paternal Aunt    Bipolar disorder Paternal Uncle    Diabetes Maternal Grandmother    Healthy Maternal Grandfather    Heart attack Paternal Grandmother     Social History Social History   Tobacco Use   Smoking status: Former    Types: Cigarettes, E-cigarettes    Quit date: 12/14/2018    Years since quitting: 2.8    Passive exposure: Current   Smokeless tobacco: Never  Vaping Use  Vaping Use: Every day  Substance Use Topics   Alcohol use: Not Currently   Drug use: Not Currently    Frequency: 4.0 times per week    Types: Marijuana    Comment: Decrease use since pregnancy "very minial every other day a few "puffs"     Allergies   Fish allergy   Review of Systems Review of Systems   Physical Exam Triage Vital Signs ED Triage Vitals  Enc Vitals Group     BP 10/23/21 1416 113/73     Pulse Rate 10/23/21 1416 81     Resp 10/23/21 1416 15     Temp 10/23/21 1416 99.3 F (37.4 C)     Temp Source 10/23/21 1416 Oral     SpO2 10/23/21 1416 97 %     Weight --      Height --      Head Circumference --      Peak Flow --      Pain Score 10/23/21 1513 8     Pain Loc --      Pain Edu? --      Excl. in GC? --    No data found.  Updated Vital Signs BP 113/73 (BP Location: Right Arm)   Pulse 81   Temp 99.3 F (37.4 C) (Oral)   Resp 15   LMP 10/16/2021    SpO2 97%   Visual Acuity Right Eye Distance:   Left Eye Distance:   Bilateral Distance:    Right Eye Near:   Left Eye Near:    Bilateral Near:     Physical Exam Constitutional:      Appearance: Normal appearance.  HENT:     Head: Normocephalic.  Eyes:     Extraocular Movements: Extraocular movements intact.  Pulmonary:     Effort: Pulmonary effort is normal.  Skin:    Comments: 2 x 3 erythematous, tender abscess noted to the pilonidal   Neurological:     Mental Status: She is alert and oriented to person, place, and time. Mental status is at baseline.  Psychiatric:        Mood and Affect: Mood normal.        Behavior: Behavior normal.     UC Treatments / Results  Labs (all labs ordered are listed, but only abnormal results are displayed) Labs Reviewed - No data to display  EKG   Radiology No results found.  Procedures Incision and Drainage  Date/Time: 10/23/2021 3:24 PM Performed by: Valinda Hoar, NP Authorized by: Valinda Hoar, NP   Consent:    Consent obtained:  Verbal   Consent given by:  Patient   Risks, benefits, and alternatives were discussed: yes     Risks discussed:  Incomplete drainage   Alternatives discussed:  Delayed treatment Universal protocol:    Procedure explained and questions answered to patient or proxy's satisfaction: yes     Patient identity confirmed:  Verbally with patient Location:    Type:  Pilonidal cyst   Size:  2x3   Location:  Anogenital   Anogenital location:  Pilonidal Pre-procedure details:    Skin preparation:  Povidone-iodine Sedation:    Sedation type:  None Anesthesia:    Anesthesia method:  Local infiltration   Local anesthetic:  Lidocaine 2% w/o epi Procedure type:    Complexity:  Simple Procedure details:    Incision types:  Single straight   Drainage:  Purulent   Drainage amount:  Copious   Wound treatment:  Wound  left open Post-procedure details:    Procedure completion:  Tolerated  (including critical care time)  Medications Ordered in UC Medications - No data to display  Initial Impression / Assessment and Plan / UC Course  I have reviewed the triage vital signs and the nursing notes.  Pertinent labs & imaging results that were available during my care of the patient were reviewed by me and considered in my medical decision making (see chart for details).  Pilonidal abscess  Able to complete incision and drainage with copious purulent drainage noted, doxycycline 7-day course prescribed advised patient to hold warm compresses to affected area at least 4 times daily to help facilitate drainage, given strict precautions for nonhealing nondraining site to return to urgent care for evaluation  Final Clinical Impressions(s) / UC Diagnoses   Final diagnoses:  Abscess of buttock, right     Discharge Instructions      Take doxycycline twice a day for the next 7 days  Hold warm-hot compresses to affected area at least 4 times a day, this helps to facilitate draining, the more the better  Please return for evaluation for increased swelling, increased tenderness or pain, non healing site, non draining site, you begin to have fever or chills   We reviewed the etiology of recurrent abscesses of skin.  Skin abscesses are collections of pus within the dermis and deeper skin tissues. Skin abscesses manifest as painful, tender, fluctuant, and erythematous nodules, frequently surmounted by a pustule and surrounded by a rim of erythematous swelling.  Spontaneous drainage of purulent material may occur.  Fever can occur on occasion.    -Skin abscesses can develop in healthy individuals with no predisposing conditions other than skin or nasal carriage of Staphylococcus aureus.  Individuals in close contact with others who have active infection with skin abscesses are at increased risk which is likely to explain why twin brother has similar episodes.   In addition, any process  leading to a breach in the skin barrier can also predispose to the development of a skin abscesses, such as atopic dermatitis.      ED Prescriptions     Medication Sig Dispense Auth. Provider   doxycycline (VIBRAMYCIN) 100 MG capsule Take 1 capsule (100 mg total) by mouth 2 (two) times daily. 14 capsule Kiran Lapine R, NP   triamcinolone cream (KENALOG) 0.1 % Apply 1 application. topically 2 (two) times daily. 30 g Valinda Hoar, NP      PDMP not reviewed this encounter.   Valinda Hoar, NP 10/23/21 1525

## 2021-10-23 NOTE — Discharge Instructions (Signed)
Take doxycycline twice a day for the next 7 days ° °Hold warm-hot compresses to affected area at least 4 times a day, this helps to facilitate draining, the more the better ° °Please return for evaluation for increased swelling, increased tenderness or pain, non healing site, non draining site, you begin to have fever or chills  ° °We reviewed the etiology of recurrent abscesses of skin.  Skin abscesses are collections of pus within the dermis and deeper skin tissues. Skin abscesses manifest as painful, tender, fluctuant, and erythematous nodules, frequently surmounted by a pustule and surrounded by a rim of erythematous swelling.  Spontaneous drainage of purulent material may occur.  Fever can occur on occasion.   ° °-Skin abscesses can develop in healthy individuals with no predisposing conditions other than skin or nasal carriage of Staphylococcus aureus.  Individuals in close contact with others who have active infection with skin abscesses are at increased risk which is likely to explain why twin brother has similar episodes.   In addition, any process leading to a breach in the skin barrier can also predispose to the development of a skin abscesses, such as atopic dermatitis.    °

## 2021-10-23 NOTE — ED Triage Notes (Signed)
Pt reports has abscess on right buttock that has become more painful over past few days. Adds has a head on it now but denies any drainage.  ?

## 2022-10-01 ENCOUNTER — Emergency Department (HOSPITAL_COMMUNITY): Payer: Medicaid Other

## 2022-10-01 ENCOUNTER — Other Ambulatory Visit: Payer: Self-pay

## 2022-10-01 ENCOUNTER — Encounter (HOSPITAL_COMMUNITY): Payer: Self-pay

## 2022-10-01 ENCOUNTER — Observation Stay (HOSPITAL_COMMUNITY)
Admission: EM | Admit: 2022-10-01 | Discharge: 2022-10-02 | Disposition: A | Discharge status: Home / Self Care | Payer: Medicaid Other | Attending: Surgery | Admitting: Surgery

## 2022-10-01 DIAGNOSIS — Y999 Unspecified external cause status: Secondary | ICD-10-CM | POA: Insufficient documentation

## 2022-10-01 DIAGNOSIS — S60512A Abrasion of left hand, initial encounter: Secondary | ICD-10-CM | POA: Diagnosis present

## 2022-10-01 DIAGNOSIS — Z833 Family history of diabetes mellitus: Secondary | ICD-10-CM

## 2022-10-01 DIAGNOSIS — S60511A Abrasion of right hand, initial encounter: Secondary | ICD-10-CM | POA: Diagnosis present

## 2022-10-01 DIAGNOSIS — S80211A Abrasion, right knee, initial encounter: Secondary | ICD-10-CM | POA: Diagnosis present

## 2022-10-01 DIAGNOSIS — S80212A Abrasion, left knee, initial encounter: Secondary | ICD-10-CM | POA: Diagnosis present

## 2022-10-01 DIAGNOSIS — Y92410 Unspecified street and highway as the place of occurrence of the external cause: Secondary | ICD-10-CM | POA: Diagnosis not present

## 2022-10-01 DIAGNOSIS — S2231XA Fracture of one rib, right side, initial encounter for closed fracture: Principal | ICD-10-CM | POA: Insufficient documentation

## 2022-10-01 DIAGNOSIS — S270XXA Traumatic pneumothorax, initial encounter: Secondary | ICD-10-CM | POA: Diagnosis not present

## 2022-10-01 DIAGNOSIS — S2241XA Multiple fractures of ribs, right side, initial encounter for closed fracture: Principal | ICD-10-CM | POA: Diagnosis present

## 2022-10-01 DIAGNOSIS — Z87891 Personal history of nicotine dependence: Secondary | ICD-10-CM

## 2022-10-01 DIAGNOSIS — S2249XA Multiple fractures of ribs, unspecified side, initial encounter for closed fracture: Secondary | ICD-10-CM | POA: Diagnosis present

## 2022-10-01 DIAGNOSIS — Z8249 Family history of ischemic heart disease and other diseases of the circulatory system: Secondary | ICD-10-CM | POA: Diagnosis not present

## 2022-10-01 DIAGNOSIS — Z825 Family history of asthma and other chronic lower respiratory diseases: Secondary | ICD-10-CM

## 2022-10-01 DIAGNOSIS — Z818 Family history of other mental and behavioral disorders: Secondary | ICD-10-CM | POA: Diagnosis not present

## 2022-10-01 DIAGNOSIS — Z23 Encounter for immunization: Secondary | ICD-10-CM

## 2022-10-01 DIAGNOSIS — J939 Pneumothorax, unspecified: Secondary | ICD-10-CM | POA: Diagnosis not present

## 2022-10-01 DIAGNOSIS — Y939 Activity, unspecified: Secondary | ICD-10-CM | POA: Diagnosis not present

## 2022-10-01 DIAGNOSIS — Y929 Unspecified place or not applicable: Secondary | ICD-10-CM | POA: Insufficient documentation

## 2022-10-01 DIAGNOSIS — F17290 Nicotine dependence, other tobacco product, uncomplicated: Secondary | ICD-10-CM | POA: Insufficient documentation

## 2022-10-01 LAB — COMPREHENSIVE METABOLIC PANEL
ALT: 31 U/L (ref 0–44)
AST: 52 U/L — ABNORMAL HIGH (ref 15–41)
Albumin: 4 g/dL (ref 3.5–5.0)
Alkaline Phosphatase: 70 U/L (ref 38–126)
Anion gap: 12 (ref 5–15)
BUN: 9 mg/dL (ref 6–20)
CO2: 22 mmol/L (ref 22–32)
Calcium: 8.7 mg/dL — ABNORMAL LOW (ref 8.9–10.3)
Chloride: 102 mmol/L (ref 98–111)
Creatinine, Ser: 1.01 mg/dL — ABNORMAL HIGH (ref 0.44–1.00)
GFR, Estimated: >60 mL/min (ref >60)
Glucose, Bld: 131 mg/dL — ABNORMAL HIGH (ref 70–99)
Potassium: 4 mmol/L (ref 3.5–5.1)
Sodium: 136 mmol/L (ref 135–145)
Total Bilirubin: 1 mg/dL (ref 0.3–1.2)
Total Protein: 7.1 g/dL (ref 6.5–8.1)

## 2022-10-01 LAB — I-STAT CHEM 8, ED
BUN: 10 mg/dL (ref 6–20)
Calcium, Ion: 1.1 mmol/L — ABNORMAL LOW (ref 1.15–1.40)
Chloride: 102 mmol/L (ref 98–111)
Creatinine, Ser: 1 mg/dL (ref 0.44–1.00)
Glucose, Bld: 130 mg/dL — ABNORMAL HIGH (ref 70–99)
HCT: 42 % (ref 36.0–46.0)
Hemoglobin: 14.3 g/dL (ref 12.0–15.0)
Potassium: 3.9 mmol/L (ref 3.5–5.1)
Sodium: 139 mmol/L (ref 135–145)
TCO2: 26 mmol/L (ref 22–32)

## 2022-10-01 LAB — I-STAT BETA HCG BLOOD, ED (MC, WL, AP ONLY): I-stat hCG, quantitative: <5 m[IU]/mL (ref <5)

## 2022-10-01 LAB — CBC
HCT: 40.7 % (ref 36.0–46.0)
HCT: 35.8 % — ABNORMAL LOW (ref 36.0–46.0)
Hemoglobin: 13.1 g/dL (ref 12.0–15.0)
Hemoglobin: 11.4 g/dL — ABNORMAL LOW (ref 12.0–15.0)
MCH: 30.2 pg (ref 26.0–34.0)
MCH: 30.1 pg (ref 26.0–34.0)
MCHC: 32.2 g/dL (ref 30.0–36.0)
MCHC: 31.8 g/dL (ref 30.0–36.0)
MCV: 93.8 fL (ref 80.0–100.0)
MCV: 94.5 fL (ref 80.0–100.0)
Platelets: 429 10*3/uL — ABNORMAL HIGH (ref 150–400)
Platelets: 376 10*3/uL (ref 150–400)
RBC: 4.34 MIL/uL (ref 3.87–5.11)
RBC: 3.79 MIL/uL — ABNORMAL LOW (ref 3.87–5.11)
RDW: 14.3 % (ref 11.5–15.5)
RDW: 14.2 % (ref 11.5–15.5)
WBC: 17.3 10*3/uL — ABNORMAL HIGH (ref 4.0–10.5)
WBC: 16.3 10*3/uL — ABNORMAL HIGH (ref 4.0–10.5)
nRBC: 0 % (ref 0.0–0.2)
nRBC: 0 % (ref 0.0–0.2)

## 2022-10-01 LAB — ETHANOL: Alcohol, Ethyl (B): <10 mg/dL (ref <10)

## 2022-10-01 LAB — LACTIC ACID, PLASMA: Lactic Acid, Venous: 2.1 mmol/L (ref 0.5–1.9)

## 2022-10-01 LAB — CREATININE, SERUM
Creatinine, Ser: 0.81 mg/dL (ref 0.44–1.00)
GFR, Estimated: >60 mL/min (ref >60)

## 2022-10-01 LAB — HIV ANTIBODY (ROUTINE TESTING W REFLEX): HIV Screen 4th Generation wRfx: NONREACTIVE

## 2022-10-01 LAB — PROTIME-INR
INR: 1.3 — ABNORMAL HIGH (ref 0.8–1.2)
Prothrombin Time: 16.2 seconds — ABNORMAL HIGH (ref 11.4–15.2)

## 2022-10-01 MED ORDER — IOHEXOL 350 MG/ML SOLN
75.0000 mL | Freq: Once | INTRAVENOUS | Status: AC | PRN
  Administered 2022-10-01: 75 mL via INTRAVENOUS

## 2022-10-01 MED ORDER — ONDANSETRON HCL 4 MG/2ML IJ SOLN
4.0000 mg | Freq: Four times a day (QID) | INTRAMUSCULAR | Status: DC | PRN
  Administered 2022-10-01: 4 mg via INTRAVENOUS
  Filled 2022-10-01: qty 2

## 2022-10-01 MED ORDER — DOCUSATE SODIUM 100 MG PO CAPS
100.0000 mg | ORAL_CAPSULE | Freq: Two times a day (BID) | ORAL | Status: DC
  Administered 2022-10-01 – 2022-10-02 (×2): 100 mg via ORAL
  Filled 2022-10-01 (×2): qty 1

## 2022-10-01 MED ORDER — SODIUM CHLORIDE 0.9 % IV BOLUS
1000.0000 mL | Freq: Once | INTRAVENOUS | Status: AC
  Administered 2022-10-01: 1000 mL via INTRAVENOUS

## 2022-10-01 MED ORDER — HYDROMORPHONE HCL 1 MG/ML IJ SOLN
1.0000 mg | Freq: Once | INTRAMUSCULAR | Status: AC
  Administered 2022-10-01: 1 mg via INTRAVENOUS
  Filled 2022-10-01: qty 1

## 2022-10-01 MED ORDER — OXYCODONE HCL 5 MG PO TABS
5.0000 mg | ORAL_TABLET | ORAL | Status: DC | PRN
  Administered 2022-10-02: 5 mg via ORAL
  Filled 2022-10-01: qty 1

## 2022-10-01 MED ORDER — ENOXAPARIN SODIUM 30 MG/0.3ML IJ SOSY
30.0000 mg | PREFILLED_SYRINGE | Freq: Two times a day (BID) | INTRAMUSCULAR | Status: DC
  Administered 2022-10-02: 30 mg via SUBCUTANEOUS
  Filled 2022-10-01: qty 0.3

## 2022-10-01 MED ORDER — PROCHLORPERAZINE EDISYLATE 10 MG/2ML IJ SOLN: 10.0000 mg | INTRAMUSCULAR | Status: DC | PRN

## 2022-10-01 MED ORDER — SODIUM CHLORIDE 0.9 % IV SOLN: INTRAVENOUS | Status: DC

## 2022-10-01 MED ORDER — SIMETHICONE 80 MG PO CHEW: 80.0000 mg | CHEWABLE_TABLET | Freq: Four times a day (QID) | ORAL | Status: DC | PRN

## 2022-10-01 MED ORDER — KETOROLAC TROMETHAMINE 15 MG/ML IJ SOLN
15.0000 mg | Freq: Three times a day (TID) | INTRAMUSCULAR | Status: DC
  Administered 2022-10-01 – 2022-10-02 (×3): 15 mg via INTRAVENOUS
  Filled 2022-10-01 (×3): qty 1

## 2022-10-01 MED ORDER — MORPHINE SULFATE (PF) 4 MG/ML IV SOLN
4.0000 mg | Freq: Once | INTRAVENOUS | Status: AC
  Administered 2022-10-01: 4 mg via INTRAVENOUS
  Filled 2022-10-01: qty 1

## 2022-10-01 MED ORDER — ACETAMINOPHEN 325 MG PO TABS
650.0000 mg | ORAL_TABLET | Freq: Four times a day (QID) | ORAL | Status: DC
  Administered 2022-10-01 – 2022-10-02 (×4): 650 mg via ORAL
  Filled 2022-10-01 (×4): qty 2

## 2022-10-01 MED ORDER — GABAPENTIN 300 MG PO CAPS
300.0000 mg | ORAL_CAPSULE | Freq: Three times a day (TID) | ORAL | Status: DC
  Administered 2022-10-01 – 2022-10-02 (×3): 300 mg via ORAL
  Filled 2022-10-01 (×5): qty 1

## 2022-10-01 MED ORDER — METHOCARBAMOL 1000 MG/10ML IJ SOLN
500.0000 mg | Freq: Four times a day (QID) | INTRAVENOUS | Status: DC | PRN
  Filled 2022-10-01: qty 5

## 2022-10-01 MED ORDER — TETANUS-DIPHTH-ACELL PERTUSSIS 5-2.5-18.5 LF-MCG/0.5 IM SUSY
0.5000 mL | PREFILLED_SYRINGE | Freq: Once | INTRAMUSCULAR | Status: AC
  Administered 2022-10-01: 0.5 mL via INTRAMUSCULAR
  Filled 2022-10-01: qty 0.5

## 2022-10-01 MED ORDER — SODIUM CHLORIDE 0.9% FLUSH
3.0000 mL | Freq: Two times a day (BID) | INTRAVENOUS | Status: DC
  Administered 2022-10-01 – 2022-10-02 (×3): 3 mL via INTRAVENOUS

## 2022-10-01 MED ORDER — SODIUM CHLORIDE 0.9 % IV SOLN: 250.0000 mL | INTRAVENOUS | Status: DC | PRN

## 2022-10-01 MED ORDER — SODIUM CHLORIDE 0.9% FLUSH: 3.0000 mL | INTRAVENOUS | Status: DC | PRN

## 2022-10-01 MED ORDER — HYDROMORPHONE HCL 1 MG/ML IJ SOLN
1.0000 mg | INTRAMUSCULAR | Status: DC | PRN
  Administered 2022-10-02: 1 mg via INTRAVENOUS
  Filled 2022-10-01: qty 1

## 2022-10-01 MED ORDER — OXYCODONE HCL 5 MG PO TABS
10.0000 mg | ORAL_TABLET | ORAL | Status: DC | PRN
  Administered 2022-10-01 – 2022-10-02 (×3): 10 mg via ORAL
  Filled 2022-10-01 (×3): qty 2

## 2022-10-01 NOTE — Progress Notes (Signed)
Orthopedic Tech Progress Note Patient Details:  April Zuniga 09/05/1995 [id number] Level 2 Trauma  Patient ID: Denton Ar, female   DOB: [birth year], 27 y.o.   MRN: [id number]  Jearld Lesch 10/01/2022, 10:35 AM
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
## 2022-10-01 NOTE — ED Notes (Signed)
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  HYDROmorphone (DILAUDID) injection 1 mg (1 mg Intravenous Given 10/01/22 1436)    Mobility walks with person assist     Focused Assessments Pulmonary Assessment Handoff:  Lung sounds: Bilateral Breath Sounds: Clear L Breath Sounds: Diminished O2 Device: Room Air O2 Flow Rate (L/min): 2 L/min Small left apical pneumothorax  , Skeletal muscular- Posterior right rib fracture   R Recommendations: See Admitting Provider Note  Report given to:   Additional Notes:
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
## 2022-10-01 NOTE — ED Notes (Signed)
Patient transported to CT 
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
## 2022-10-01 NOTE — ED Notes (Signed)
Pt's visitor stated pt would like to remain in the hospital.
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
## 2022-10-01 NOTE — ED Notes (Signed)
Pt nauseated with episode of emesis, will give PO meds upon nausea resolution.

## 2022-10-01 NOTE — Progress Notes (Signed)
   10/01/22 1015  Spiritual Encounters  Type of Visit Initial  Care provided to: Patient  Conversation partners present during encounter Nurse  Referral source Trauma page  Reason for visit Trauma  OnCall Visit Yes   Chp responded to Trauma 2 page.  Pt unavailable as medical team conducted their work.  No suppot person present; no support persons arrived. Chp remains available by page.
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
## 2022-10-02 ENCOUNTER — Other Ambulatory Visit (HOSPITAL_COMMUNITY): Payer: Self-pay

## 2022-10-02 ENCOUNTER — Telehealth (HOSPITAL_COMMUNITY): Payer: Self-pay | Admitting: Pharmacy Technician

## 2022-10-02 ENCOUNTER — Inpatient Hospital Stay (HOSPITAL_COMMUNITY): Payer: Medicaid Other

## 2022-10-02 LAB — BASIC METABOLIC PANEL
Anion gap: 9 (ref 5–15)
BUN: 5 mg/dL — ABNORMAL LOW (ref 6–20)
CO2: 22 mmol/L (ref 22–32)
Calcium: 7.9 mg/dL — ABNORMAL LOW (ref 8.9–10.3)
Chloride: 103 mmol/L (ref 98–111)
Creatinine, Ser: 0.85 mg/dL (ref 0.44–1.00)
GFR, Estimated: >60 mL/min (ref >60)
Glucose, Bld: 95 mg/dL (ref 70–99)
Potassium: 3 mmol/L — ABNORMAL LOW (ref 3.5–5.1)
Sodium: 134 mmol/L — ABNORMAL LOW (ref 135–145)

## 2022-10-02 LAB — CBC
HCT: 31.7 % — ABNORMAL LOW (ref 36.0–46.0)
Hemoglobin: 10.6 g/dL — ABNORMAL LOW (ref 12.0–15.0)
MCH: 30.4 pg (ref 26.0–34.0)
MCHC: 33.4 g/dL (ref 30.0–36.0)
MCV: 90.8 fL (ref 80.0–100.0)
Platelets: 344 10*3/uL (ref 150–400)
RBC: 3.49 MIL/uL — ABNORMAL LOW (ref 3.87–5.11)
RDW: 14.2 % (ref 11.5–15.5)
WBC: 8.3 10*3/uL (ref 4.0–10.5)
nRBC: 0 % (ref 0.0–0.2)

## 2022-10-02 LAB — SAMPLE TO BLOOD BANK

## 2022-10-02 MED ORDER — METHOCARBAMOL 500 MG PO TABS
500.0000 mg | ORAL_TABLET | Freq: Four times a day (QID) | ORAL | Status: DC
  Administered 2022-10-02 (×2): 500 mg via ORAL
  Filled 2022-10-02 (×2): qty 1

## 2022-10-02 MED ORDER — LIDOCAINE 5 % EX PTCH
1.0000 | MEDICATED_PATCH | CUTANEOUS | Status: DC
  Administered 2022-10-02: 1 via TRANSDERMAL
  Filled 2022-10-02: qty 1

## 2022-10-02 MED ORDER — LIDOCAINE 5 % EX PTCH
1.0000 | MEDICATED_PATCH | CUTANEOUS | 0 refills | Status: AC
  Filled 2022-10-02: qty 30, 30d supply, fill #0

## 2022-10-02 MED ORDER — METHOCARBAMOL 750 MG PO TABS
750.0000 mg | ORAL_TABLET | Freq: Four times a day (QID) | ORAL | 1 refills | Status: AC
  Filled 2022-10-02: qty 120, 30d supply, fill #0
  Filled 2022-10-25: qty 120, 30d supply, fill #1

## 2022-10-02 MED ORDER — IBUPROFEN 600 MG PO TABS
600.0000 mg | ORAL_TABLET | Freq: Four times a day (QID) | ORAL | 1 refills | Status: AC
  Filled 2022-10-02: qty 120, 30d supply, fill #0
  Filled 2022-10-25: qty 120, 30d supply, fill #1

## 2022-10-02 MED ORDER — IBUPROFEN 600 MG PO TABS: 600.0000 mg | ORAL_TABLET | Freq: Four times a day (QID) | ORAL | 1 refills | Status: AC

## 2022-10-02 MED ORDER — METHOCARBAMOL 500 MG PO TABS: 500.0000 mg | ORAL_TABLET | Freq: Four times a day (QID) | ORAL | Status: DC | PRN

## 2022-10-02 MED ORDER — BACITRACIN ZINC 500 UNIT/GM EX OINT
TOPICAL_OINTMENT | Freq: Two times a day (BID) | CUTANEOUS | Status: DC
  Filled 2022-10-02: qty 28.4

## 2022-10-02 MED ORDER — OXYCODONE HCL 5 MG PO TABS
5.0000 mg | ORAL_TABLET | ORAL | 0 refills | Status: AC | PRN
  Filled 2022-10-02: qty 30, 5d supply, fill #0

## 2022-10-02 MED ORDER — DOCUSATE SODIUM 100 MG PO CAPS
100.0000 mg | ORAL_CAPSULE | Freq: Two times a day (BID) | ORAL | 2 refills | Status: AC
  Filled 2022-10-02: qty 60, 30d supply, fill #0
  Filled 2022-10-25: qty 100, 50d supply, fill #1

## 2022-10-02 MED ORDER — GABAPENTIN 300 MG PO CAPS
300.0000 mg | ORAL_CAPSULE | Freq: Three times a day (TID) | ORAL | 0 refills | Status: AC
  Filled 2022-10-02: qty 90, 30d supply, fill #0

## 2022-10-02 MED ORDER — ACETAMINOPHEN 500 MG PO TABS
1000.0000 mg | ORAL_TABLET | Freq: Four times a day (QID) | ORAL | 3 refills | Status: AC
  Filled 2022-10-02: qty 120, 15d supply, fill #0
  Filled 2022-10-25: qty 120, 15d supply, fill #1

## 2022-10-02 MED ORDER — BACITRACIN ZINC 500 UNIT/GM EX OINT: TOPICAL_OINTMENT | Freq: Two times a day (BID) | CUTANEOUS | 0 refills | Status: AC
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
## 2022-10-02 NOTE — Progress Notes (Addendum)
Patient vitals stable overnight. C/o right chest and side pain with movement in addition to intermittent left side pain and right hip pain. Patient reports that pain medication seems to help her to "calm nerves and relax, but doesn't really relieve the pain"; informed patient that she will feel the pain, but hopefully medication will help her manage it well enough to ambulate. No O2 desaturation while sleeping on room air.  Ambulatory in room with some weakness secondary to pain, but no physical/postural concerns limiting ambulation.  Patient expresses gratitude to staff overnight for their assistance. Bed low and locked; call bell within reach.
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
## 2022-10-02 NOTE — Progress Notes (Signed)
Discharge:  Pt d/c from room via wheelchair, Family member with the pt.  Discharge instructions given to the patient and family members.  No questions from pt,   Pt dressed in street clothes and left with discharge papers and prescriptions  in hand.  IV d/ced, tele removed and no complaints of pain or discomfort.
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
## 2022-10-02 NOTE — Telephone Encounter (Signed)
Patient Advocate Encounter  Prior Authorization for Lidocaine 5% patches has been approved.    PA# [id number] Key: [id number] Effective dates: 10/02/2022 through 10/02/2023      Lyndel Safe, [place] Patient Advocate Specialist [place] Patient Advocate Team Direct Number: [phone]  Fax: [phone]

## 2022-10-04 ENCOUNTER — Other Ambulatory Visit (HOSPITAL_COMMUNITY): Payer: Self-pay

## 2022-10-25 ENCOUNTER — Other Ambulatory Visit: Payer: Self-pay

## 2022-10-26 ENCOUNTER — Other Ambulatory Visit: Payer: Self-pay

## 2022-10-27 ENCOUNTER — Other Ambulatory Visit: Payer: Self-pay

## 2022-11-14 ENCOUNTER — Other Ambulatory Visit: Payer: Self-pay

## 2023-08-14 ENCOUNTER — Other Ambulatory Visit: Payer: Self-pay

## 2023-08-14 ENCOUNTER — Emergency Department
Admission: EM | Admit: 2023-08-14 | Discharge: 2023-08-14 | Disposition: A | Discharge status: Home / Self Care | Payer: Medicaid Other | Attending: Student in an Organized Health Care Education/Training Program | Admitting: Student in an Organized Health Care Education/Training Program

## 2023-08-14 DIAGNOSIS — J101 Influenza due to other identified influenza virus with other respiratory manifestations: Secondary | ICD-10-CM | POA: Insufficient documentation

## 2023-08-14 DIAGNOSIS — R531 Weakness: Secondary | ICD-10-CM | POA: Insufficient documentation

## 2023-08-14 DIAGNOSIS — Z20822 Contact with and (suspected) exposure to covid-19: Secondary | ICD-10-CM | POA: Insufficient documentation

## 2023-08-14 DIAGNOSIS — R509 Fever, unspecified: Secondary | ICD-10-CM | POA: Diagnosis present

## 2023-08-14 LAB — RESP PANEL BY RT-PCR (RSV, FLU A&B, COVID)  RVPGX2
Influenza A by PCR: POSITIVE — AB
Influenza B by PCR: NEGATIVE
Resp Syncytial Virus by PCR: NEGATIVE
SARS Coronavirus 2 by RT PCR: NEGATIVE

## 2023-08-14 NOTE — ED Provider Notes (Signed)
 [facility] Provider Note  Patient Contact: 5:25 PM (approximate)   History   Weakness   HPI  April Zuniga is a 28 y.o. female who presents to the emergency department with fever, body aches, chills.  Patient comes with her child who has similar symptoms.  No chest pain, shortness of breath, abdominal pain.  Patient has been managing symptoms with Mucinex and over-the-counter meds.     Physical Exam   Triage Vital Signs: ED Triage Vitals  Encounter Vitals Group     BP 08/14/23 1416 120/89     Systolic BP Percentile --      Diastolic BP Percentile --      Pulse Rate 08/14/23 1416 98     Resp 08/14/23 1416 16     Temp 08/14/23 1416 98.3 F (36.8 C)     Temp Source 08/14/23 1416 Oral     SpO2 08/14/23 1416 97 %     Weight 08/14/23 1417 145 lb (65.8 kg)     Height 08/14/23 1417 5' 6 (1.676 m)     Head Circumference --      Peak Flow --      Pain Score 08/14/23 1417 0     Pain Loc --      Pain Education --      Exclude from Growth Chart --     Most recent vital signs: Vitals:   08/14/23 1416  BP: 120/89  Pulse: 98  Resp: 16  Temp: 98.3 F (36.8 C)  SpO2: 97%     General: Alert and in no acute distress. ENT:      Ears:       Nose: No congestion/rhinnorhea.      Mouth/Throat: Mucous membranes are moist  Cardiovascular:  Good peripheral perfusion Respiratory: Normal respiratory effort without tachypnea or retractions. Lungs CTAB. Good air entry to the bases with no decreased or absent breath sounds Musculoskeletal: Full range of motion to all extremities.  Neurologic:  No gross focal neurologic deficits are appreciated.  Skin:   No rash noted Other:   ED Results / Procedures / Treatments   Labs (all labs ordered are listed, but only abnormal results are displayed) Labs Reviewed  RESP PANEL BY RT-PCR (RSV, FLU A&B, COVID)  [id number] - Abnormal; Notable for the following components:      Result Value   Influenza A by PCR POSITIVE  (*)    All other components within normal limits     EKG     RADIOLOGY    No results found.  PROCEDURES:  Critical Care performed: No  Procedures   MEDICATIONS ORDERED IN ED: Medications - No data to display   IMPRESSION / MDM / ASSESSMENT AND PLAN / ED COURSE  I reviewed the triage vital signs and the nursing notes.                                 Differential diagnosis includes, but is not limited to, influenza, COVID, parainfluenza virus, RSV, cold   Patient's presentation is most consistent with acute presentation with potential threat to life or bodily function.   Patient's diagnosis is consistent with influenza A.  Patient presents to the emergency department with viral URI symptoms.  Patient is here with her child who also has similar symptoms.  Patient tested positive for influenza A.  Discussed Tamiflu and patient opts for over-the-counter meds which I agree with.  Encourage fluids, rest, over-the-counter medications.  Concerning signs and symptoms and return precautions discussed with the patient.  Follow-up primary care as needed.. Patient is given ED precautions to return to the ED for any worsening or new symptoms.     FINAL CLINICAL IMPRESSION(S) / ED DIAGNOSES   Final diagnoses:  Influenza A     Rx / DC Orders   ED Discharge Orders     None        Note:  This document was prepared using Dragon voice recognition software and may include unintentional dictation errors.   Ana Dorn JONETTA DEVONNA 08/14/23 1831    Lang Dover, MD 08/14/23 ALVIE

## 2023-08-14 NOTE — ED Provider Triage Note (Signed)
 Emergency Medicine Provider Triage Evaluation Note  April Zuniga , a 28 y.o. female  was evaluated in triage.  Pt complains of flu symptoms and bilateral leg weakness. Child with similar symptoms.  Physical Exam  BP 120/89 (BP Location: Right Arm)   Pulse 98   Temp 98.3 F (36.8 C) (Oral)   Resp 16   SpO2 97%  Gen:   Awake, no distress   Resp:  Normal effort  MSK:   Moves extremities without difficulty  Other:    Medical Decision Making  Medically screening exam initiated at 2:17 PM.  Appropriate orders placed.  Shona ONEIDA Brain was informed that the remainder of the evaluation will be completed by another provider, this initial triage assessment does not replace that evaluation, and the importance of remaining in the ED until their evaluation is complete.    Herlinda Kirk NOVAK, FNP 08/14/23 1421

## 2023-08-14 NOTE — ED Triage Notes (Signed)
 Pt c/o leg weakness for a few days and flu symptoms

## 2023-12-19 ENCOUNTER — Ambulatory Visit (LOCAL_COMMUNITY_HEALTH_CENTER): Admitting: Family Medicine

## 2023-12-19 ENCOUNTER — Encounter: Payer: Self-pay | Admitting: Family Medicine

## 2023-12-19 VITALS — BP 117/76 | HR 98 | Wt 167.4 lb

## 2023-12-19 DIAGNOSIS — Z309 Encounter for contraceptive management, unspecified: Secondary | ICD-10-CM | POA: Diagnosis not present

## 2023-12-19 DIAGNOSIS — F32A Depression, unspecified: Secondary | ICD-10-CM

## 2023-12-19 DIAGNOSIS — Z0000 Encounter for general adult medical examination without abnormal findings: Secondary | ICD-10-CM

## 2023-12-19 DIAGNOSIS — Z113 Encounter for screening for infections with a predominantly sexual mode of transmission: Secondary | ICD-10-CM

## 2023-12-19 DIAGNOSIS — Z3009 Encounter for other general counseling and advice on contraception: Secondary | ICD-10-CM

## 2023-12-19 DIAGNOSIS — Z8619 Personal history of other infectious and parasitic diseases: Secondary | ICD-10-CM

## 2023-12-19 LAB — HM HIV SCREENING LAB: HM HIV Screening: NEGATIVE

## 2023-12-19 LAB — WET PREP FOR TRICH, YEAST, CLUE
Clue Cell Exam: NEGATIVE
Trichomonas Exam: NEGATIVE
Yeast Exam: NEGATIVE

## 2023-12-19 MED ORDER — ULIPRISTAL ACETATE 30 MG PO TABS: 1.0000 | ORAL_TABLET | Freq: Once | ORAL | 0 refills | Status: AC

## 2023-12-19 NOTE — Addendum Note (Signed)
 Addended by: Earleen Glazier on: 12/19/2023 04:23 PM   Modules accepted: Orders

## 2023-12-19 NOTE — Progress Notes (Signed)
 Patient is here for [facility] visit. FP packet given to patient and contents reviewed. Wet prep results reviewed with pt, no treatment required per standing order. Pt has been dispensed ECP to be picked up at the pharmacy. Condoms declined. Austine Lefort, RN.

## 2023-12-19 NOTE — Progress Notes (Addendum)
 [employer] HEALTH DEPARTMENT [facility] [address] [place] [address] Main phone: [phone]  Family Planning Visit - Initial Visit  Subjective:  April Zuniga is a 28 y.o.  [phone]   being seen today for an initial annual visit and to discuss reproductive life planning.  The patient is currently using no method - no contraceptive precautions for pregnancy prevention. Patient does not want a pregnancy in the next year.   Patient reports they are looking for a method with the following characteristics:  Declines all BCM  Patient has the following medical conditions: Patient Active Problem List   Diagnosis Date Noted   Rib fractures 10/01/2022   History of syphilis 01/18/2021   Nicotine vapor product user currently 01/13/2021    No chief complaint on file.   HPI Patient reports to clinic for PE.   Patient denies concerns about self    Review of Systems  Constitutional:  Negative for weight loss.  Eyes:  Negative for blurred vision.  Respiratory:  Negative for cough and shortness of breath.   Cardiovascular:  Negative for claudication.  Gastrointestinal:  Negative for nausea.  Genitourinary:  Negative for dysuria and frequency.  Skin:  Negative for rash.  Neurological:  Negative for headaches.  Endo/Heme/Allergies:  Does not bruise/bleed easily.  Psychiatric/Behavioral:  Positive for depression.     Diabetes screening This patient is 29 y.o. with a BMI of Body mass index is 27.02 kg/m.Aaron Aas  Is patient eligible for diabetes screening (age >35 and BMI >25)?  no  Was Hgb A1c ordered? not applicable  STI screening Patient reports 1 of partners in last year.  Does this patient desire STI screening?  Yes  Hepatitis C screening Has patient been screened once for HCV in the past?  No  No results found for: "HCVAB"  Does the patient meet criteria for HCV testing? No  (If yes-- Screen for HCV through [facility]) Criteria:   Since the last HCV result, does the patient have any of the following? - Current drug use - Have a partner with drug use - Has been incarcerated  Hepatitis B screening Does the patient meet criteria for HBV testing? No Criteria:  -Household, sexual or needle sharing contact with HBV -History of drug use -HIV positive -Those with known Hep C  Cervical Cancer Screening  Result Date Procedure Results Follow-ups  01/13/2021 Pap IG (Image Guided) DIAGNOSIS:: Comment Specimen adequacy:: Comment Clinician Provided ICD10: Comment Performed by:: Comment QC reviewed by:: Comment PAP Smear Comment: . Note:: Comment Test Methodology: Comment     Health Maintenance Due  Topic Date Due   HPV VACCINES (1 - 3-dose series) Never done   COVID-19 Vaccine (3 - 2024-25 season) 04/08/2023   Cervical Cancer Screening (Pap smear)  01/14/2024    The following portions of the patient's history were reviewed and updated as appropriate: allergies, current medications, past family history, past medical history, past social history, past surgical history and problem list. Problem list updated.  See flowsheet for further details and programmatic requirements Hyperlink available at the top of the signed note in blue.  Flow sheet content below:  Pregnancy Intention Screening Does the patient want to become pregnant in the next year?: No Does the patient's partner want to become pregnant in the next year?: No Would the patient like to discuss contraceptive options today?: No Results Follow up Password: [email or web address] Contraception History Past methods of contraception used by patient:: Hormonal Injection, Female Condom  Adverse effects associated with Hormonal Injection: bleedming Adverse effects associated with Female Condom: none Sexual History What age did you start your period?: 15 How often do you have your period?: monthly Date of last sex?: 12/16/23 Has the patient had unprotected sex within the  last 5 days?: Yes Do you have sex with men, women, both men and women?: Men only In the past 2 months how many partners have you had sex with?: 2 In the past 12 months, how many partners have you had sex with?: 2 Is it possible that any of your sex partners in the past 12 months had sex with someone else whild they were still in a sexual relationship with you?: No What ways do you have sex?: Vaginal, Oral Do you or your partner use condoms and/or dental dams every time you have vaginal, oral or anal sex?: Sometimes Do you douche?: No Date of last HIV test?: 10/01/22 Have you ever had an STD?: No Have any of your partners had an STD?: No Have you or your partner ever shot up drugs?: No Have any of your partners used drugs in the past?: No Have you or your partners exchanged money or drugs for sex?: No Risk Factors for Hep B Household, sexual, or needle sharing contact of a person infected with Hep B: No Sexual contact with a person who uses drugs not as prescribed?: No Currently or Ever used drugs not as prescribed: No HIV Positive: No PRep Patient: No Men who have sex with men: N/A Have Hepatitis C: No History of Incarceration: No History of Homeslessness?: No Anal sex following anal drug use?: No Risk Factors for Hep C Currently using drugs not as prescribed: No Sexual partner(s) currently using drugs as not prescribed: No History of drug use: No HIV Positive: No People with a history of incarceration: No People born between the years of 60 and 46: No Counseling All Patients: Use specific methods of contraceoptive and identify adverse effects (R), Stop tobacco use, implementing the 5A counseling approach (R), Encourage mammagram for women 47 or older and younger than 50 if conditions support (R), Emergency Contraception Offered (R) if unprotected sex in past 5 days and/or propyhlactically as indicated., Provide emergency contraception counseling (R), Typical use rates for method  effectiveness (R), Delay future pregnancy from 18 months to 5 years (R) at [facility] visit, Appropriate referral for additional services as needed (R) Education: Make informed decision about family planning Contraception Wrap Up Current Method: No Contraceptive Precautions End Method: No Contraception Precautions Contraception Counseling Provided: Yes How was the end contraceptive method provided?: Provided on site  Objective:   Vitals:   12/19/23 1535  BP: 117/76  Pulse: 98  Weight: 167 lb 6.4 oz (75.9 kg)    Physical Exam Constitutional:      Appearance: Normal appearance.  HENT:     Head: Normocephalic and atraumatic.  Pulmonary:     Effort: Pulmonary effort is normal.  Abdominal:     Palpations: Abdomen is soft.  Musculoskeletal:        General: Normal range of motion.  Skin:    General: Skin is warm and dry.  Neurological:     General: No focal deficit present.     Mental Status: She is alert.  Psychiatric:        Mood and Affect: Mood normal.        Behavior: Behavior normal.     Assessment and Plan:  April Zuniga is a 28 y.o. female presenting  to the [facility] Department for an initial annual wellness/contraceptive visit  1. Family planning (Primary) Contraception counseling:  Reviewed options based on patient desire and reproductive life plan. Patient is interested in No Method - No Contraceptive Precautions. This was provided to the patient today.   Risks, benefits, and typical effectiveness rates were reviewed.  Questions were answered.  Written information was also given to the patient to review.    The patient will follow up in  1 years for surveillance.  The patient was told to call with any further questions, or with any concerns about this method of contraception.  Emphasized use of condoms 100% of the time for STI prevention.  Emergency Contraception Precautions (ECP): Patient assessed for need of ECP. She is a candidate based on report of  unprotected sex within past 120 hours (5 days).  Educated on ECP and reviewed options.  Patient desire Ozie Bo.  2. History of syphilis   3. Screening for venereal disease  - Chlamydia/Gonorrhea [facility] - HIV [place] [facility] - Syphilis Serology,  Lab - WET PREP FOR TRICH, YEAST, CLUE  4. Well woman exam (no gynecological exam) -CBE deferred today -Pap due in June- pt insured- will defer until RV, or pt can come back in > 1 month for pap  5. Depression, unspecified depression type -has 28 yo, FOB paralyzed after accident -reports she has positive and negative days- but does not want referral to therapy -has a therapist with [employer]   Return in about 1 year (around 12/18/2024) for annual well-woman exam.  No future appointments.  Earleen Glazier, [place]

## 2024-03-06 ENCOUNTER — Ambulatory Visit: Admitting: Family Medicine

## 2024-03-06 VITALS — BP 133/84 | HR 125 | Ht 66.0 in | Wt 158.2 lb

## 2024-03-06 DIAGNOSIS — N926 Irregular menstruation, unspecified: Secondary | ICD-10-CM | POA: Insufficient documentation

## 2024-03-06 DIAGNOSIS — Z113 Encounter for screening for infections with a predominantly sexual mode of transmission: Secondary | ICD-10-CM

## 2024-03-06 DIAGNOSIS — Z309 Encounter for contraceptive management, unspecified: Secondary | ICD-10-CM | POA: Diagnosis not present

## 2024-03-06 DIAGNOSIS — Z8249 Family history of ischemic heart disease and other diseases of the circulatory system: Secondary | ICD-10-CM

## 2024-03-06 DIAGNOSIS — R030 Elevated blood-pressure reading, without diagnosis of hypertension: Secondary | ICD-10-CM | POA: Insufficient documentation

## 2024-03-06 DIAGNOSIS — Z3202 Encounter for pregnancy test, result negative: Secondary | ICD-10-CM | POA: Diagnosis not present

## 2024-03-06 LAB — WET PREP FOR TRICH, YEAST, CLUE
Clue Cell Exam: NEGATIVE
Trichomonas Exam: NEGATIVE
Yeast Exam: NEGATIVE

## 2024-03-06 LAB — PREGNANCY, URINE: Preg Test, Ur: NEGATIVE

## 2024-03-06 NOTE — Progress Notes (Signed)
 [facility] problem visit  [facility]  Subjective:  April Zuniga is a 28 y.o. being seen today for   Chief Complaint  Patient presents with   Acute Visit   LMP 7/21, lasted one full week, was a normal period for her. Bleeding just started this morning, she is concerned about irregular bleeding. Never occurred before. She is concerned because google said it could be an STI or even cancer. Also having some pain in her rectum and anus - reports onset today with irregular bleeding.   Denies vaginal discharge, rashes, sores, dysuria, fever, chills, lymphadenopathy.   Last sex last weekend. Sometimes condom use - not consistent. One partner in the last couple of months.  Does the patient have a current or past history of drug use? Yes   No components found for: HCV]  Health Maintenance Due  Topic Date Due   HPV VACCINES (1 - 3-dose series) Never done   Hepatitis B Vaccines (1 of 3 - 19+ 3-dose series) Never done   COVID-19 Vaccine (3 - 2024-25 season) 04/08/2023   Cervical Cancer Screening (Pap smear)  01/14/2024   Review of Systems  Constitutional:  Negative for fever, malaise/fatigue and weight loss.  Respiratory:  Negative for shortness of breath.   Cardiovascular:  Negative for chest pain and palpitations.  (+) vaginal bleeding (+) easy bleeding, I'm a free bleeder (+) difficulty breathing (attributed to anxiety) (+) weight changes  The following portions of the patient's history were reviewed and updated as appropriate: allergies, current medications, past family history, past medical history, past social history, past surgical history and problem list. Problem list updated.  See flowsheet for other program required questions.  Objective:   Vitals:   03/06/24 1044 03/06/24 1150  BP: (!) 138/91 133/84  Pulse: (!) 125   Weight: 158 lb 3.2 oz (71.8 kg)   Height: 5' 6 (1.676 m)    Physical Exam Vitals and nursing note reviewed.  Exam conducted with a chaperone present Robie Shuck, Charity fundraiser).  Constitutional:      Appearance: Normal appearance.  HENT:     Head: Normocephalic and atraumatic.     Mouth/Throat:     Mouth: Mucous membranes are moist.     Pharynx: Oropharynx is clear. No oropharyngeal exudate or posterior oropharyngeal erythema.  Eyes:     General: No scleral icterus.       Right eye: No discharge.        Left eye: No discharge.     Conjunctiva/sclera:     Right eye: Right conjunctiva is injected.     Left eye: Left conjunctiva is injected.  Pulmonary:     Effort: Pulmonary effort is normal.  Genitourinary:    General: Normal vulva.     Exam position: Lithotomy position.     Pubic Area: No rash or pubic lice.      Tanner stage (genital): 5.     Labia:        Right: No rash, tenderness or lesion.        Left: No rash, tenderness or lesion.      Vagina: No foreign body. Bleeding present. No vaginal discharge, erythema, tenderness or lesions.     Cervix: Cervical bleeding present. No cervical motion tenderness, discharge, friability, lesion or erythema.     Comments: pH not collected due to blood in vaginal vault Musculoskeletal:     Cervical back: Neck supple. No rigidity or tenderness.  Lymphadenopathy:  Head:     Right side of head: No submandibular, preauricular or posterior auricular adenopathy.     Left side of head: No submandibular, preauricular or posterior auricular adenopathy.     Cervical: No cervical adenopathy.     Right cervical: No superficial or posterior cervical adenopathy.    Left cervical: No superficial or posterior cervical adenopathy.     Upper Body:     Right upper body: No supraclavicular adenopathy.     Left upper body: No supraclavicular adenopathy.  Skin:    General: Skin is warm and dry.     Capillary Refill: Capillary refill takes less than 2 seconds.     Coloration: Skin is not jaundiced or pale.     Findings: No bruising, erythema, lesion or rash.   Neurological:     General: No focal deficit present.     Mental Status: She is alert and oriented to person, place, and time.  Psychiatric:        Mood and Affect: Mood is anxious.        Speech: Speech is rapid and pressured.        Behavior: Behavior is cooperative.    Assessment and Plan:  April Zuniga is a 28 y.o. female presenting to the [facility] Department for a Women's Health problem visit  Patient without any current contraception complaining of irregular bleeding. Counseled on options which include watchful waiting, scheduled NSAIDs, or starting hormonal contraceptive method.   Irregular menses Assessment & Plan: Acute irregular bleeding, starting today, first ever occurrence. UPT negative, but recent unprotected intercourse still makes her at risk for unintended pregnancy. Discussed options of watchful waiting, NSAIDs, and hormonal contraception. She elects for watchful waiting and reduction of flow with aleve  550 mg BID x 3 days.   Orders: -     Pregnancy, urine  Screening examination for venereal disease -     WET PREP FOR TRICH, YEAST, CLUE -     Gonococcus culture -     Chlamydia/Gonorrhea [facility]  Elevated BP without diagnosis of hypertension Assessment & Plan: BP elevated x 2. Discussed need to establish with primary care doctor and have this evaluated, especially    Family history of hypertension   Return if symptoms worsen or fail to improve.  No future appointments.  Betsey CHRISTELLA Helling, MD

## 2024-03-06 NOTE — Progress Notes (Signed)
 Patient is here for family planning visit. Family planning education card given to patient. Wet prep results reviewed; no treatment required per standing orders. All questions answered and verbalizes understanding.   Doyce CINDERELLA Shuck, RN

## 2024-03-06 NOTE — Assessment & Plan Note (Signed)
 Acute irregular bleeding, starting today, first ever occurrence. UPT negative, but recent unprotected intercourse still makes her at risk for unintended pregnancy. Discussed options of watchful waiting, NSAIDs, and hormonal contraception. She elects for watchful waiting and reduction of flow with aleve  550 mg BID x 3 days.

## 2024-03-06 NOTE — Patient Instructions (Signed)
 STI screening - Today we obtained a vaginal swab to screen for gonorrhea, chlamydia, and trichomonas and oral swab to screen for gonorrhea - Today you declined blood work. We are happy to have you back any time to have blood work for HIV and syphilis at your convenience.  - If the results are abnormal, I will give you a call.    Estimated time frame for results collected at the [facility] Department: Same day Trichomonas Yeast BV (bacterial vaginosis)  Within 1-2 weeks Gonorrhea Chlamydia  Within 1-2 weeks HIV Syphilis Hepatitis B Hepatitis C    Irregular bleeding Today your pregnancy test came back negative.  This pregnancy test only tells us  you are not pregnant from sex 3 or more weeks ago.  It does not tell us  about possible early pregnancy from sex within the last 3 weeks.   We talked about watchful waiting, trying NSAIDs, and trying hormonal birth control.  You want to do watchful waiting and trying NSAIDS.   Aleve  (naproxen ): Take 550 mg (two tablets) twice daily for 3 days.

## 2024-03-06 NOTE — Assessment & Plan Note (Signed)
 BP elevated x 2. Discussed need to establish with primary care doctor and have this evaluated, especially

## 2024-03-11 LAB — GONOCOCCUS CULTURE

## 2024-07-12 ENCOUNTER — Ambulatory Visit
Admission: EM | Admit: 2024-07-12 | Discharge: 2024-07-12 | Disposition: A | Discharge status: Home / Self Care | Attending: Physician Assistant | Admitting: Physician Assistant

## 2024-07-12 ENCOUNTER — Ambulatory Visit

## 2024-07-12 DIAGNOSIS — S161XXA Strain of muscle, fascia and tendon at neck level, initial encounter: Secondary | ICD-10-CM | POA: Diagnosis not present

## 2024-07-12 DIAGNOSIS — R0789 Other chest pain: Secondary | ICD-10-CM | POA: Diagnosis not present

## 2024-07-12 DIAGNOSIS — Z8709 Personal history of other diseases of the respiratory system: Secondary | ICD-10-CM

## 2024-07-12 DIAGNOSIS — M545 Low back pain, unspecified: Secondary | ICD-10-CM
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
## 2024-07-12 NOTE — Discharge Instructions (Addendum)
 BACK PAIN: Stressed avoiding painful activities . RICE (REST, ICE, COMPRESSION, ELEVATION) guidelines reviewed. May alternate ice and heat. Consider use of muscle rubs, Salonpas patches, etc. Use medications as directed including muscle relaxers if prescribed. Take anti-inflammatory medications as prescribed or OTC NSAIDs/Tylenol .  F/u with PCP in 7-10 days for reexamination, and please feel free to call or return to the urgent care at any time for any questions or concerns you may have and we will be happy to help you!   BACK PAIN RED FLAGS: If the back pain acutely worsens or there are any red flag symptoms such as numbness/tingling, leg weakness, saddle anesthesia, or loss of bowel/bladder control, go immediately to the ER. Follow up with us  as scheduled or sooner if the pain does not begin to resolve or if it worsens before the follow up    NECK PAIN: Stressed avoiding painful activities. This can exacerbate your symptoms and make them worse.  May apply heat to the areas of pain for some relief. Use medications as directed. Be aware of which medications make you drowsy and do not drive or operate any kind of heavy machinery while using the medication (ie pain medications or muscle relaxers). F/U with PCP for reexamination or return sooner if condition worsens or does not begin to improve over the next few days.   NECK PAIN RED FLAGS: If symptoms get worse than they are right now, you should come back sooner for re-evaluation. If you have increased numbness/ tingling or notice that the numbness/tingling is affecting the legs or saddle region, go to ER. If you ever lose continence go to ER.      - Go to ER if acute worsening of any of your pain specially of abdominal pain, chest pain, neck pain, back pain.
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
